# Patient Record
Sex: Female | Born: 1945 | Race: White | Hispanic: No | Marital: Married | Smoking: Never smoker
Health system: Southern US, Community
[De-identification: ages and names within clinical notes are randomized; demographics above are authoritative.]

## PROBLEM LIST (undated history)

## (undated) DIAGNOSIS — E785 Hyperlipidemia, unspecified: Secondary | ICD-10-CM

## (undated) DIAGNOSIS — N390 Urinary tract infection, site not specified: Secondary | ICD-10-CM

## (undated) DIAGNOSIS — G43909 Migraine, unspecified, not intractable, without status migrainosus: Secondary | ICD-10-CM

## (undated) DIAGNOSIS — I10 Essential (primary) hypertension: Secondary | ICD-10-CM

## (undated) HISTORY — PX: ABDOMINAL HYSTERECTOMY: SHX81

---

## 2009-04-25 ENCOUNTER — Observation Stay (HOSPITAL_COMMUNITY): Admission: EM | Admit: 2009-04-25 | Discharge: 2009-04-25 | Payer: Self-pay | Admitting: Emergency Medicine

## 2010-05-20 LAB — COMPREHENSIVE METABOLIC PANEL
ALT: 29 U/L (ref 0–35)
AST: 27 U/L (ref 0–37)
Albumin: 2.9 g/dL — ABNORMAL LOW (ref 3.5–5.2)
BUN: 11 mg/dL (ref 6–23)
CO2: 23 mEq/L (ref 19–32)
Chloride: 106 mEq/L (ref 96–112)
GFR calc Af Amer: >60 mL/min (ref >60)
Glucose, Bld: 126 mg/dL — ABNORMAL HIGH (ref 70–99)
Sodium: 136 mEq/L (ref 135–145)
Total Protein: 6.1 g/dL (ref 6.0–8.3)

## 2010-05-20 LAB — BASIC METABOLIC PANEL
Chloride: 104 mEq/L (ref 96–112)
Potassium: 3.8 mEq/L (ref 3.5–5.1)

## 2010-05-20 LAB — DIFFERENTIAL
Basophils Relative: 0 % (ref 0–1)
Basophils Relative: 0 % (ref 0–1)
Eosinophils Absolute: 0 10*3/uL (ref 0.0–0.7)
Eosinophils Absolute: 0 10*3/uL (ref 0.0–0.7)
Eosinophils Relative: 0 % (ref 0–5)
Lymphs Abs: 1.2 10*3/uL (ref 0.7–4.0)
Lymphs Abs: 1.6 10*3/uL (ref 0.7–4.0)
Monocytes Absolute: 0.8 10*3/uL (ref 0.1–1.0)
Monocytes Relative: 11 % (ref 3–12)
Neutro Abs: 3.8 10*3/uL (ref 1.7–7.7)
Neutrophils Relative %: 68 % (ref 43–77)

## 2010-05-20 LAB — GLUCOSE, CAPILLARY
Glucose-Capillary: 68 mg/dL — ABNORMAL LOW (ref 70–99)
Glucose-Capillary: 74 mg/dL (ref 70–99)
Glucose-Capillary: 80 mg/dL (ref 70–99)

## 2010-05-20 LAB — POCT CARDIAC MARKERS
CKMB, poc: <1 ng/mL — ABNORMAL LOW (ref 1.0–8.0)
Myoglobin, poc: 81.4 ng/mL (ref 12–200)

## 2010-05-20 LAB — CBC
HCT: 41.9 % (ref 36.0–46.0)
Hemoglobin: 14.3 g/dL (ref 12.0–15.0)
MCHC: 34.1 g/dL (ref 30.0–36.0)
MCHC: 34.8 g/dL (ref 30.0–36.0)
MCV: 86.9 fL (ref 78.0–100.0)
Platelets: 181 10*3/uL (ref 150–400)
Platelets: 232 10*3/uL (ref 150–400)
WBC: 5.8 10*3/uL (ref 4.0–10.5)

## 2010-05-20 LAB — RAPID STREP SCREEN (MED CTR MEBANE ONLY): Streptococcus, Group A Screen (Direct): NEGATIVE

## 2010-05-20 LAB — CULTURE, BLOOD (ROUTINE X 2)
Culture: NO GROWTH
Report Status: 3072011

## 2010-05-20 LAB — POCT I-STAT, CHEM 8
BUN: 13 mg/dL (ref 6–23)
Chloride: 108 mEq/L (ref 96–112)
Glucose, Bld: 69 mg/dL — ABNORMAL LOW (ref 70–99)
Potassium: 3.7 mEq/L (ref 3.5–5.1)
Sodium: 140 mEq/L (ref 135–145)

## 2010-05-20 LAB — HEMOGLOBIN A1C: Mean Plasma Glucose: 154 mg/dL

## 2011-02-03 ENCOUNTER — Encounter (HOSPITAL_COMMUNITY): Payer: Self-pay | Admitting: Pharmacy Technician

## 2011-02-10 ENCOUNTER — Encounter (HOSPITAL_COMMUNITY)
Admission: RE | Disposition: A | Discharge status: Home / Self Care | Payer: Self-pay | Source: Ambulatory Visit | Attending: Ophthalmology

## 2011-02-10 ENCOUNTER — Ambulatory Visit (HOSPITAL_COMMUNITY)
Admission: RE | Admit: 2011-02-10 | Discharge: 2011-02-10 | Disposition: A | Discharge status: Home / Self Care | Payer: Medicare Other | Source: Ambulatory Visit | Attending: Ophthalmology | Admitting: Ophthalmology

## 2011-02-10 ENCOUNTER — Encounter (HOSPITAL_COMMUNITY): Payer: Self-pay | Admitting: *Deleted

## 2011-02-10 DIAGNOSIS — Z7982 Long term (current) use of aspirin: Secondary | ICD-10-CM | POA: Insufficient documentation

## 2011-02-10 DIAGNOSIS — H109 Unspecified conjunctivitis: Secondary | ICD-10-CM | POA: Insufficient documentation

## 2011-02-10 DIAGNOSIS — I10 Essential (primary) hypertension: Secondary | ICD-10-CM | POA: Insufficient documentation

## 2011-02-10 DIAGNOSIS — E119 Type 2 diabetes mellitus without complications: Secondary | ICD-10-CM | POA: Insufficient documentation

## 2011-02-10 DIAGNOSIS — H4010X0 Unspecified open-angle glaucoma, stage unspecified: Secondary | ICD-10-CM | POA: Insufficient documentation

## 2011-02-10 HISTORY — DX: Essential (primary) hypertension: I10

## 2011-02-10 SURGERY — SLT LASER APPLICATION
Anesthesia: LOCAL | Site: Eye | Laterality: Right

## 2011-02-10 MED ORDER — PILOCARPINE HCL 1 % OP SOLN
2.0000 [drp] | Freq: Once | OPHTHALMIC | Status: AC
  Administered 2011-02-10: 2 [drp] via OPHTHALMIC

## 2011-02-10 MED ORDER — APRACLONIDINE HCL 1 % OP SOLN
OPHTHALMIC | Status: AC
  Administered 2011-02-10: 1 [drp] via OPHTHALMIC
  Filled 2011-02-10: qty 0.1

## 2011-02-10 MED ORDER — HYPROMELLOSE (GONIOSCOPIC) 2.5 % OP SOLN
OPHTHALMIC | Status: AC
  Filled 2011-02-10: qty 15

## 2011-02-10 MED ORDER — PILOCARPINE HCL 1 % OP SOLN
OPHTHALMIC | Status: AC
  Administered 2011-02-10: 2 [drp] via OPHTHALMIC
  Filled 2011-02-10: qty 15

## 2011-02-10 MED ORDER — APRACLONIDINE HCL 1 % OP SOLN
1.0000 [drp] | OPHTHALMIC | Status: AC
  Administered 2011-02-10 (×3): 1 [drp] via OPHTHALMIC

## 2011-02-10 MED ORDER — TETRACAINE HCL 0.5 % OP SOLN
1.0000 [drp] | Freq: Once | OPHTHALMIC | Status: AC
  Administered 2011-02-10: 1 [drp] via OPHTHALMIC

## 2011-02-10 MED ORDER — TETRACAINE HCL 0.5 % OP SOLN
OPHTHALMIC | Status: AC
  Administered 2011-02-10: 1 [drp] via OPHTHALMIC
  Filled 2011-02-10: qty 2

## 2011-02-10 NOTE — H&P (Signed)
Pt interviewed and examined without changes.

## 2011-02-10 NOTE — Procedures (Signed)
Linda Patrick 02/10/2011  [name]  Yag Laser Self Test Completedyes. Procedure:  SLT laser OD.  Eye Protection Worn by Staff yes. Laser In Use Sign on Door yes.  Laser: Nd:YAG Spot Size: Fixed Power Setting: 0.9 mJ/application Position treated: 360 deg Number of applications: 129 Total energy delivered: 116 mJ  Gonioscopy OD was performed and the angle was open grade 3 to 4 360 degree. There were no recessions or synechiae noted.  There was little pigment in the angle.  The patient tolerated the procedure without difficulty. No complications were encountered.  Tenometer reading immediately after procedure: 19 mmHg.  The patient was discharged home with the instructions to discontinue all her current glaucoma medications in the right eye and to continue them in the left eye.   Patient instructed to go to office as per instruction sheet for intraocular pressure check.  Patient verbalizes understanding of discharge instructions yes.   Notes:none

## 2011-02-10 NOTE — OR Nursing (Signed)
IO eye pressure 19 post procedure

## 2012-04-01 ENCOUNTER — Encounter (HOSPITAL_COMMUNITY): Payer: Self-pay

## 2012-04-01 ENCOUNTER — Emergency Department (HOSPITAL_COMMUNITY)
Admission: EM | Admit: 2012-04-01 | Discharge: 2012-04-01 | Disposition: A | Discharge status: Home / Self Care | Payer: Medicare Other | Attending: Emergency Medicine | Admitting: Emergency Medicine

## 2012-04-01 DIAGNOSIS — Z7982 Long term (current) use of aspirin: Secondary | ICD-10-CM | POA: Insufficient documentation

## 2012-04-01 DIAGNOSIS — N390 Urinary tract infection, site not specified: Secondary | ICD-10-CM | POA: Insufficient documentation

## 2012-04-01 DIAGNOSIS — R319 Hematuria, unspecified: Secondary | ICD-10-CM | POA: Insufficient documentation

## 2012-04-01 DIAGNOSIS — I10 Essential (primary) hypertension: Secondary | ICD-10-CM | POA: Insufficient documentation

## 2012-04-01 DIAGNOSIS — R300 Dysuria: Secondary | ICD-10-CM | POA: Insufficient documentation

## 2012-04-01 DIAGNOSIS — E119 Type 2 diabetes mellitus without complications: Secondary | ICD-10-CM | POA: Insufficient documentation

## 2012-04-01 DIAGNOSIS — Z79899 Other long term (current) drug therapy: Secondary | ICD-10-CM | POA: Insufficient documentation

## 2012-04-01 LAB — URINALYSIS, ROUTINE W REFLEX MICROSCOPIC
Bilirubin Urine: NEGATIVE
Ketones, ur: NEGATIVE mg/dL
Nitrite: POSITIVE — AB
Urobilinogen, UA: 0.2 mg/dL (ref 0.0–1.0)

## 2012-04-01 LAB — URINE MICROSCOPIC-ADD ON

## 2012-04-01 MED ORDER — PHENAZOPYRIDINE HCL 200 MG PO TABS: 200.0000 mg | ORAL_TABLET | Freq: Three times a day (TID) | ORAL | Status: DC

## 2012-04-01 MED ORDER — CIPROFLOXACIN HCL 250 MG PO TABS: 250.0000 mg | ORAL_TABLET | Freq: Two times a day (BID) | ORAL | Status: DC

## 2012-04-01 MED ORDER — PHENAZOPYRIDINE HCL 100 MG PO TABS
200.0000 mg | ORAL_TABLET | Freq: Once | ORAL | Status: AC
  Administered 2012-04-01: 200 mg via ORAL
  Filled 2012-04-01: qty 2

## 2012-04-01 MED ORDER — CIPROFLOXACIN HCL 250 MG PO TABS
500.0000 mg | ORAL_TABLET | Freq: Once | ORAL | Status: AC
  Administered 2012-04-01: 500 mg via ORAL
  Filled 2012-04-01: qty 2

## 2012-04-01 NOTE — ED Provider Notes (Signed)
History     CSN: [id number]  Arrival date & time 04/01/12  0149   First MD Initiated Contact with Patient 04/01/12 0214      Chief Complaint  Patient presents with  . Urinary Frequency    (Consider location/radiation/quality/duration/timing/severity/associated sxs/prior treatment) HPI Linda Patrick is a 67 y.o. female who presents to the Emergency Department complaining of urinary frequency and buring with urination x 2 days. Tonight the frequency became the problem. She was going to the bathroom every few minutes each time with severe pain. She has taken no medicines.Denies back pain, nausea, vomiting.   PCP Dr. Charm Barges Past Medical History  Diagnosis Date  . Hypertension   . Diabetes mellitus     History reviewed. No pertinent past surgical history.  No family history on file.  History  Substance Use Topics  . Smoking status: Never Smoker   . Smokeless tobacco: Not on file  . Alcohol Use: No    OB History    Grav Para Term Preterm Abortions TAB SAB Ect Mult Living                  Review of Systems  Constitutional: Negative for fever.       10 Systems reviewed and are negative for acute change except as noted in the HPI.  HENT: Negative for congestion.   Eyes: Negative for discharge and redness.  Respiratory: Negative for cough and shortness of breath.   Cardiovascular: Negative for chest pain.  Gastrointestinal: Negative for vomiting and abdominal pain.  Genitourinary: Positive for dysuria, frequency and hematuria.  Musculoskeletal: Negative for back pain.  Skin: Negative for rash.  Neurological: Negative for syncope, numbness and headaches.  Psychiatric/Behavioral:       No behavior change.    Allergies  Nitrofuran derivatives; Nubain; and Vistaril  Home Medications   Current Outpatient Rx  Name  Route  Sig  Dispense  Refill  . AMITRIPTYLINE HCL 50 MG PO TABS   Oral   Take 50 mg by mouth at bedtime.           . ASPIRIN EC 81 MG PO TBEC   Oral  Take 81 mg by mouth daily.           Marland Kitchen BENAZEPRIL HCL 20 MG PO TABS   Oral   Take 20 mg by mouth daily.           Marland Kitchen VITAMIN D-3 5000 UNITS PO TABS   Oral   Take 1 tablet by mouth 2 (two) times daily.           . FENOFIBRATE 160 MG PO TABS   Oral   Take 160 mg by mouth daily.           Marland Kitchen GLIMEPIRIDE 4 MG PO TABS   Oral   Take 8 mg by mouth daily before breakfast.           . IBUPROFEN 200 MG PO TABS   Oral   Take 400 mg by mouth every 6 (six) hours as needed. For pain          . METFORMIN HCL 1000 MG PO TABS   Oral   Take 1,000 mg by mouth daily with breakfast.           . PRAVASTATIN SODIUM 40 MG PO TABS   Oral   Take 40 mg by mouth daily.           Marland Kitchen OLMESARTAN MEDOXOMIL-HCTZ 40-12.5 MG PO TABS  Oral   Take 1 tablet by mouth daily.             BP 177/90  Pulse 91  Temp 97.7 F (36.5 C) (Oral)  Resp 18  Ht 5\' 6"  (1.676 m)  Wt 145 lb (65.772 kg)  BMI 23.40 kg/m2  SpO2 100%  Physical Exam  Nursing note and vitals reviewed. HENT:  Head: Atraumatic.  Eyes: Right eye exhibits no discharge. Left eye exhibits no discharge.  Neck: Neck supple.  Pulmonary/Chest: Effort normal. She exhibits no tenderness.  Abdominal: Soft. There is no tenderness. There is no rebound.  Musculoskeletal: She exhibits no tenderness.       Baseline ROM, no obvious new focal weakness.  Neurological:       Mental status and motor strength appears baseline for patient and situation.  Skin: No rash noted.  Psychiatric: She has a normal mood and affect.    ED Course  Procedures (including critical care time) Results for orders placed during the hospital encounter of 04/01/12  URINALYSIS, ROUTINE W REFLEX MICROSCOPIC      Component Value Range   Color, Urine YELLOW  YELLOW   APPearance TURBID (*) CLEAR   Specific Gravity, Urine 1.025  1.005 - 1.030   pH 6.0  5.0 - 8.0   Glucose, UA NEGATIVE  NEGATIVE mg/dL   Hgb urine dipstick LARGE (*) NEGATIVE   Bilirubin Urine  NEGATIVE  NEGATIVE   Ketones, ur NEGATIVE  NEGATIVE mg/dL   Protein, ur 161 (*) NEGATIVE mg/dL   Urobilinogen, UA 0.2  0.0 - 1.0 mg/dL   Nitrite POSITIVE (*) NEGATIVE   Leukocytes, UA MODERATE (*) NEGATIVE  URINE MICROSCOPIC-ADD ON      Component Value Range   Squamous Epithelial / LPF RARE  RARE   WBC, UA TOO NUMEROUS TO COUNT  <3 WBC/hpf   RBC / HPF TOO NUMEROUS TO COUNT  <3 RBC/hpf   Bacteria, UA MANY (*) RARE         MDM  Patient with urinary frequency and dysuria. UA positive for infection. Given pyridium and cipro. UA culture pending. Pt stable in ED with no significant deterioration in condition.The patient appears reasonably screened and/or stabilized for discharge and I doubt any other medical condition or other [facility] requiring further screening, evaluation, or treatment in the ED at this time prior to discharge. MDM Reviewed: nursing note and vitals Interpretation: labs           Nicoletta Dress. Colon Branch, MD 04/01/12 0960

## 2012-04-01 NOTE — ED Notes (Signed)
Pain and burning with urination, frequency x 2 days

## 2012-04-02 LAB — URINE CULTURE

## 2012-04-03 NOTE — ED Notes (Signed)
+  Urine. Patient treated with Cipro. Sensitive to same. Per protocol MD. °

## 2012-04-09 MED ORDER — TROPICAMIDE 1 % OP SOLN: 1.0000 [drp] | OPHTHALMIC | Status: DC

## 2012-04-12 ENCOUNTER — Ambulatory Visit (HOSPITAL_COMMUNITY)
Admission: RE | Admit: 2012-04-12 | Discharge: 2012-04-12 | Disposition: A | Discharge status: Home / Self Care | Payer: Medicare Other | Source: Ambulatory Visit | Attending: Ophthalmology | Admitting: Ophthalmology

## 2012-04-12 ENCOUNTER — Encounter (HOSPITAL_COMMUNITY)
Admission: RE | Disposition: A | Discharge status: Home / Self Care | Payer: Self-pay | Source: Ambulatory Visit | Attending: Ophthalmology

## 2012-04-12 ENCOUNTER — Encounter (HOSPITAL_COMMUNITY): Payer: Self-pay | Admitting: *Deleted

## 2012-04-12 DIAGNOSIS — E119 Type 2 diabetes mellitus without complications: Secondary | ICD-10-CM | POA: Insufficient documentation

## 2012-04-12 DIAGNOSIS — H4010X0 Unspecified open-angle glaucoma, stage unspecified: Secondary | ICD-10-CM | POA: Insufficient documentation

## 2012-04-12 HISTORY — DX: Hyperlipidemia, unspecified: E78.5

## 2012-04-12 HISTORY — PX: SLT LASER APPLICATION: SHX6099

## 2012-04-12 HISTORY — DX: Migraine, unspecified, not intractable, without status migrainosus: G43.909

## 2012-04-12 SURGERY — SLT LASER APPLICATION
Anesthesia: LOCAL | Laterality: Left

## 2012-04-12 MED ORDER — TETRACAINE HCL 0.5 % OP SOLN
OPHTHALMIC | Status: AC
  Filled 2012-04-12: qty 2

## 2012-04-12 MED ORDER — APRACLONIDINE HCL 1 % OP SOLN
1.0000 [drp] | OPHTHALMIC | Status: AC
  Administered 2012-04-12 (×3): 1 [drp] via OPHTHALMIC

## 2012-04-12 MED ORDER — PILOCARPINE HCL 1 % OP SOLN
OPHTHALMIC | Status: AC
  Filled 2012-04-12: qty 15

## 2012-04-12 MED ORDER — TETRACAINE HCL 0.5 % OP SOLN
1.0000 [drp] | Freq: Once | OPHTHALMIC | Status: AC
  Administered 2012-04-12: 1 [drp] via OPHTHALMIC

## 2012-04-12 MED ORDER — PILOCARPINE HCL 1 % OP SOLN
1.0000 [drp] | Freq: Once | OPHTHALMIC | Status: AC
  Administered 2012-04-12: 1 [drp] via OPHTHALMIC

## 2012-04-12 MED ORDER — APRACLONIDINE HCL 1 % OP SOLN
OPHTHALMIC | Status: AC
  Filled 2012-04-12: qty 0.1

## 2012-04-12 NOTE — H&P (Signed)
I have reviewed the pre printed H&P, the patient was re-examined, and I have identified no significant interval changes in the patient's medical condition.  There is no change in the plan of care since the history and physical of record. 

## 2012-04-12 NOTE — Brief Op Note (Signed)
Linda Patrick 04/12/2012  [name], MD  Yag Laser Self Test Completedyes. Procedure: SLT, left eye.  Eye Protection Worn by Staff yes. Laser In Use Sign on Door yes.  Laser: Nd:YAG Spot Size: Fixed Burst Mode: III Power Setting: 1.0-0.9 mJ/burst Position treated: 360 degrees trabecular mechwork Number of shots: 127 Total energy delivered: 115 mJ  The patient tolerated the procedure without difficulty. No complications were encountered.  Tenometer reading immediately after procedure: 22 mmHg.  The patient was discharged home with the instructions to discontinue all her current glaucoma medications.   Patient verbalizes understanding of discharge instructions yes.   Notes:gonioscopy open 360 degrees with several clock hours of closedangle superiorly, otherwise grade 4/4 open with mild pigmentation

## 2012-04-12 NOTE — Op Note (Signed)
No op note required 

## 2012-04-14 ENCOUNTER — Encounter (HOSPITAL_COMMUNITY): Payer: Self-pay | Admitting: Ophthalmology

## 2013-07-21 ENCOUNTER — Encounter (HOSPITAL_COMMUNITY): Payer: Self-pay | Admitting: Emergency Medicine

## 2013-07-21 ENCOUNTER — Emergency Department (HOSPITAL_COMMUNITY)
Admission: EM | Admit: 2013-07-21 | Discharge: 2013-07-21 | Disposition: A | Discharge status: Home / Self Care | Payer: Medicare Other | Attending: Emergency Medicine | Admitting: Emergency Medicine

## 2013-07-21 DIAGNOSIS — S81852A Open bite, left lower leg, initial encounter: Secondary | ICD-10-CM

## 2013-07-21 DIAGNOSIS — Z79899 Other long term (current) drug therapy: Secondary | ICD-10-CM | POA: Insufficient documentation

## 2013-07-21 DIAGNOSIS — Z23 Encounter for immunization: Secondary | ICD-10-CM | POA: Insufficient documentation

## 2013-07-21 DIAGNOSIS — Y9389 Activity, other specified: Secondary | ICD-10-CM | POA: Insufficient documentation

## 2013-07-21 DIAGNOSIS — Z7982 Long term (current) use of aspirin: Secondary | ICD-10-CM | POA: Insufficient documentation

## 2013-07-21 DIAGNOSIS — E119 Type 2 diabetes mellitus without complications: Secondary | ICD-10-CM | POA: Insufficient documentation

## 2013-07-21 DIAGNOSIS — E785 Hyperlipidemia, unspecified: Secondary | ICD-10-CM | POA: Insufficient documentation

## 2013-07-21 DIAGNOSIS — W540XXA Bitten by dog, initial encounter: Secondary | ICD-10-CM | POA: Insufficient documentation

## 2013-07-21 DIAGNOSIS — Y929 Unspecified place or not applicable: Secondary | ICD-10-CM | POA: Insufficient documentation

## 2013-07-21 DIAGNOSIS — S81009A Unspecified open wound, unspecified knee, initial encounter: Secondary | ICD-10-CM | POA: Insufficient documentation

## 2013-07-21 DIAGNOSIS — S81809A Unspecified open wound, unspecified lower leg, initial encounter: Principal | ICD-10-CM

## 2013-07-21 DIAGNOSIS — G43909 Migraine, unspecified, not intractable, without status migrainosus: Secondary | ICD-10-CM | POA: Insufficient documentation

## 2013-07-21 DIAGNOSIS — S91009A Unspecified open wound, unspecified ankle, initial encounter: Principal | ICD-10-CM

## 2013-07-21 DIAGNOSIS — I10 Essential (primary) hypertension: Secondary | ICD-10-CM | POA: Insufficient documentation

## 2013-07-21 LAB — CBG MONITORING, ED: GLUCOSE-CAPILLARY: 157 mg/dL — AB (ref 70–99)

## 2013-07-21 MED ORDER — BACITRACIN-NEOMYCIN-POLYMYXIN 400-5-5000 EX OINT
TOPICAL_OINTMENT | Freq: Once | CUTANEOUS | Status: AC
  Administered 2013-07-21: 20:00:00 via TOPICAL
  Filled 2013-07-21: qty 1

## 2013-07-21 MED ORDER — HYDROCODONE-ACETAMINOPHEN 5-325 MG PO TABS
1.0000 | ORAL_TABLET | Freq: Once | ORAL | Status: DC
  Filled 2013-07-21: qty 1

## 2013-07-21 MED ORDER — HYDROCODONE-ACETAMINOPHEN 5-325 MG PO TABS: 1.0000 | ORAL_TABLET | Freq: Four times a day (QID) | ORAL | Status: DC | PRN

## 2013-07-21 MED ORDER — TETANUS-DIPHTH-ACELL PERTUSSIS 5-2.5-18.5 LF-MCG/0.5 IM SUSP
0.5000 mL | Freq: Once | INTRAMUSCULAR | Status: AC
  Administered 2013-07-21: 0.5 mL via INTRAMUSCULAR
  Filled 2013-07-21: qty 0.5

## 2013-07-21 MED ORDER — ONDANSETRON HCL 4 MG PO TABS
4.0000 mg | ORAL_TABLET | Freq: Once | ORAL | Status: DC
  Filled 2013-07-21: qty 1

## 2013-07-21 MED ORDER — AMOXICILLIN-POT CLAVULANATE 875-125 MG PO TABS: 1.0000 | ORAL_TABLET | Freq: Two times a day (BID) | ORAL | Status: DC

## 2013-07-21 MED ORDER — AMOXICILLIN-POT CLAVULANATE 875-125 MG PO TABS
1.0000 | ORAL_TABLET | Freq: Once | ORAL | Status: AC
  Administered 2013-07-21: 1 via ORAL
  Filled 2013-07-21: qty 1

## 2013-07-21 NOTE — ED Notes (Signed)
Sheriff in to see patient

## 2013-07-21 NOTE — ED Provider Notes (Signed)
CSN: [id number]     Arrival date & time 07/21/13  1747 History   First MD Initiated Contact with Patient 07/21/13 1843     Chief Complaint  Patient presents with  . Animal Bite     (Consider location/radiation/quality/duration/timing/severity/associated sxs/prior Treatment) HPI Comments:  Patient is a 68 year old female who presents to the emergency department with an animal bite. The patient states that a family members dog bit her on the left lower leg. She noted some mild bleeding, but this is been controlled with pressure. The patient has notified the sheriff and they will come out to observe the dog for any illnesses or diseases. The patient states she is diabetic, but she denies being on any anticoagulation medications. She's not had any previous operations or procedures on the left lower extremity.   Patient is a 68 y.o. female presenting with animal bite. The history is provided by the patient.  Animal Bite Contact animal:  Dog   Past Medical History  Diagnosis Date  . Hypertension   . Diabetes mellitus   . Hyperlipemia   . Migraine    Past Surgical History  Procedure Laterality Date  . Abdominal hysterectomy    . Slt laser application Left 04/12/2012    Procedure: SLT LASER APPLICATION;  Surgeon: Susa Simmondsarroll F Haines, MD;  Location: [facility];  Service: Ophthalmology;  Laterality: Left;   History reviewed. No pertinent family history. History  Substance Use Topics  . Smoking status: Never Smoker   . Smokeless tobacco: Not on file  . Alcohol Use: No   OB History   Grav Para Term Preterm Abortions TAB SAB Ect Mult Living                 Review of Systems  Constitutional: Negative for activity change.       All ROS Neg except as noted in HPI  HENT: Negative for nosebleeds.   Eyes: Negative for photophobia and discharge.  Respiratory: Negative for cough, shortness of breath and wheezing.   Cardiovascular: Negative for chest pain and palpitations.  Gastrointestinal:  Negative for abdominal pain and blood in stool.  Genitourinary: Negative for dysuria, frequency and hematuria.  Musculoskeletal: Positive for arthralgias. Negative for back pain and neck pain.  Skin: Negative.   Neurological: Positive for headaches. Negative for dizziness, seizures and speech difficulty.  Psychiatric/Behavioral: Negative for hallucinations and confusion.      Allergies  Nitrofuran derivatives; Nubain; and Vistaril  Home Medications   Prior to Admission medications   Medication Sig Start Date End Date Taking? Authorizing Provider  amitriptyline (ELAVIL) 50 MG tablet Take 50 mg by mouth at bedtime.     Yes Historical Provider, MD  amLODipine (NORVASC) 5 MG tablet Take 5 mg by mouth daily.   Yes Historical Provider, MD  aspirin EC 81 MG tablet Take 81 mg by mouth daily.     Yes Historical Provider, MD  benazepril (LOTENSIN) 20 MG tablet Take 20 mg by mouth daily.     Yes Historical Provider, MD  Cholecalciferol (VITAMIN D-3) 5000 UNITS TABS Take 1 tablet by mouth 2 (two) times daily.     Yes Historical Provider, MD  fenofibrate 160 MG tablet Take 160 mg by mouth daily.     Yes Historical Provider, MD  glimepiride (AMARYL) 4 MG tablet Take 8 mg by mouth daily before breakfast.     Yes Historical Provider, MD  ibuprofen (ADVIL,MOTRIN) 200 MG tablet Take 400 mg by mouth every 6 (six) hours as needed.  For pain    Yes Historical Provider, MD  metFORMIN (GLUCOPHAGE) 1000 MG tablet Take 1,000 mg by mouth daily with breakfast.     Yes Historical Provider, MD  olmesartan-hydrochlorothiazide (BENICAR HCT) 40-12.5 MG per tablet Take 1 tablet by mouth daily.     Yes Historical Provider, MD  pravastatin (PRAVACHOL) 40 MG tablet Take 40 mg by mouth daily.     Yes Historical Provider, MD   BP 139/73  Pulse 93  Temp(Src) 98.4 F (36.9 C) (Oral)  Resp 18  SpO2 98% Physical Exam  Nursing note and vitals reviewed. Constitutional: She is oriented to person, place, and time. She appears  well-developed and well-nourished.  Non-toxic appearance.  HENT:  Head: Normocephalic.  Right Ear: Tympanic membrane and external ear normal.  Left Ear: Tympanic membrane and external ear normal.  Eyes: EOM and lids are normal. Pupils are equal, round, and reactive to light.  Neck: Normal range of motion. Neck supple. Carotid bruit is not present.  Cardiovascular: Normal rate, regular rhythm, normal heart sounds, intact distal pulses and normal pulses.   Pulmonary/Chest: Breath sounds normal. No respiratory distress.  Abdominal: Soft. Bowel sounds are normal. There is no tenderness. There is no guarding.  Musculoskeletal: Normal range of motion.       Legs: There is full range of motion of the left hip and knee. The animal bite just below the knee on the lateral surface. There is full range of motion of the left ankle, and full range of motion of all toes. The dorsalis pedis pulses 2+.  Lymphadenopathy:       Head (right side): No submandibular adenopathy present.       Head (left side): No submandibular adenopathy present.    She has no cervical adenopathy.  Neurological: She is alert and oriented to person, place, and time. She has normal strength. No cranial nerve deficit or sensory deficit.  Skin: Skin is warm and dry.  Psychiatric: She has a normal mood and affect. Her speech is normal.    ED Course  Procedures (including critical care time) Labs Review Labs Reviewed  CBG MONITORING, ED - Abnormal; Notable for the following:    Glucose-Capillary 157 (*)    All other components within normal limits    Imaging Review No results found.   EKG Interpretation None      MDM The sheriff came to the emergency department to get information from the family concerning the dog. They will coordinate pain the dog and observe for any illnesses or diseases.  Prescription for Augmentin has been given to the patient. Prescription for Norco was also given to the patient. She is encouraged  to use Tylenol or ibuprofen first and to use the Norco if needed. The patient is also given instructions to return if any signs of increasing infection.    Final diagnoses:  None    **I have reviewed nursing notes, vital signs, and all appropriate lab and imaging results for this patient.Kathie Dike, PA-C 07/21/13 2013

## 2013-07-21 NOTE — ED Notes (Signed)
Pt presents with dog bite to left lower leg. Bleeding controlled pta. Pt states she notified sheriff about incident.

## 2013-07-21 NOTE — ED Provider Notes (Signed)
Medical screening examination/treatment/procedure(s) were performed by non-physician practitioner and as supervising physician I was immediately available for consultation/collaboration.   EKG Interpretation None        Gilda Crease, MD 07/21/13 2015

## 2013-07-21 NOTE — ED Notes (Signed)
Animal Control notified of patients dog bite. Sheriff coming to [employer] to speak with patient

## 2013-07-21 NOTE — Discharge Instructions (Signed)
Please cleanse the wound to your leg with soap and water daily, and apply a Band-Aid or dressing. Please use the Augmentin 2 times daily with food. Use Tylenol or ibuprofen for mild soreness, may use Norco for more severe soreness. Norco may cause drowsiness, please use with caution. The animal control office will call you with information concerning the dog that bit you, and whether or not any additional rabies medications will be needed. Animal Bite An animal bite can result in a scratch on the skin, deep open cut, puncture of the skin, crush injury, or tearing away of the skin or a body part. Dogs are responsible for most animal bites. Children are bitten more often than adults. An animal bite can range from very mild to more serious. A small bite from your house pet is no cause for alarm. However, some animal bites can become infected or injure a bone or other tissue. You must seek medical care if:  The skin is broken and bleeding does not slow down or stop after 15 minutes.  The puncture is deep and difficult to clean (such as a cat bite).  Pain, warmth, redness, or pus develops around the wound.  The bite is from a stray animal or rodent. There may be a risk of rabies infection.  The bite is from a snake, raccoon, skunk, fox, coyote, or bat. There may be a risk of rabies infection.  The person bitten has a chronic illness such as diabetes, liver disease, or cancer, or the person takes medicine that lowers the immune system.  There is concern about the location and severity of the bite. It is important to clean and protect an animal bite wound right away to prevent infection. Follow these steps:  Clean the wound with plenty of water and soap.  Apply an antibiotic cream.  Apply gentle pressure over the wound with a clean towel or gauze to slow or stop bleeding.  Elevate the affected area above the heart to help stop any bleeding.  Seek medical care. Getting medical care within 8 hours  of the animal bite leads to the best possible outcome. DIAGNOSIS  Your caregiver will most likely:  Take a detailed history of the animal and the bite injury.  Perform a wound exam.  Take your medical history. Blood tests or X-rays may be performed. Sometimes, infected bite wounds are cultured and sent to a lab to identify the infectious bacteria.  TREATMENT  Medical treatment will depend on the location and type of animal bite as well as the patient's medical history. Treatment may include:  Wound care, such as cleaning and flushing the wound with saline solution, bandaging, and elevating the affected area.  Antibiotics.  Tetanus immunization.  Rabies immunization.  Leaving the wound open to heal. This is often done with animal bites, due to the high risk of infection. However, in certain cases, wound closure with stitches, wound adhesive, skin adhesive strips, or staples may be used. Infected bites that are left untreated may require intravenous (IV) antibiotics and surgical treatment in the hospital. HOME CARE INSTRUCTIONS  Follow your caregiver's instructions for wound care.  Take all medicines as directed.  If your caregiver prescribes antibiotics, take them as directed. Finish them even if you start to feel better.  Follow up with your caregiver for further exams or immunizations as directed. You may need a tetanus shot if:  You cannot remember when you had your last tetanus shot.  You have never had a tetanus  shot.  The injury broke your skin. If you get a tetanus shot, your arm may swell, get red, and feel warm to the touch. This is common and not a problem. If you need a tetanus shot and you choose not to have one, there is a rare chance of getting tetanus. Sickness from tetanus can be serious. SEEK MEDICAL CARE IF:  You notice warmth, redness, soreness, swelling, pus discharge, or a bad smell coming from the wound.  You have a red line on the skin coming from  the wound.  You have a fever, chills, or a general ill feeling.  You have nausea or vomiting.  You have continued or worsening pain.  You have trouble moving the injured part.  You have other questions or concerns. MAKE SURE YOU:  Understand these instructions.  Will watch your condition.  Will get help right away if you are not doing well or get worse. Document Released: 10/29/2010 Document Revised: 05/05/2011 Document Reviewed: 10/29/2010 [facility] Patient Information 2014 [place], [place].

## 2014-05-11 ENCOUNTER — Emergency Department (HOSPITAL_COMMUNITY): Payer: Medicare Other

## 2014-05-11 ENCOUNTER — Inpatient Hospital Stay (HOSPITAL_COMMUNITY)
Admission: EM | Admit: 2014-05-11 | Discharge: 2014-05-12 | DRG: 194 | Disposition: A | Discharge status: Home / Self Care | Payer: Medicare Other | Attending: Internal Medicine | Admitting: Internal Medicine

## 2014-05-11 ENCOUNTER — Encounter (HOSPITAL_COMMUNITY): Payer: Self-pay

## 2014-05-11 DIAGNOSIS — R197 Diarrhea, unspecified: Secondary | ICD-10-CM | POA: Diagnosis not present

## 2014-05-11 DIAGNOSIS — E785 Hyperlipidemia, unspecified: Secondary | ICD-10-CM | POA: Diagnosis not present

## 2014-05-11 DIAGNOSIS — E119 Type 2 diabetes mellitus without complications: Secondary | ICD-10-CM

## 2014-05-11 DIAGNOSIS — Z79899 Other long term (current) drug therapy: Secondary | ICD-10-CM

## 2014-05-11 DIAGNOSIS — R509 Fever, unspecified: Secondary | ICD-10-CM

## 2014-05-11 DIAGNOSIS — E876 Hypokalemia: Secondary | ICD-10-CM | POA: Diagnosis not present

## 2014-05-11 DIAGNOSIS — Z7982 Long term (current) use of aspirin: Secondary | ICD-10-CM

## 2014-05-11 DIAGNOSIS — B349 Viral infection, unspecified: Secondary | ICD-10-CM

## 2014-05-11 DIAGNOSIS — Z23 Encounter for immunization: Secondary | ICD-10-CM | POA: Diagnosis not present

## 2014-05-11 DIAGNOSIS — I10 Essential (primary) hypertension: Secondary | ICD-10-CM | POA: Diagnosis not present

## 2014-05-11 DIAGNOSIS — J09X2 Influenza due to identified novel influenza A virus with other respiratory manifestations: Principal | ICD-10-CM | POA: Diagnosis present

## 2014-05-11 DIAGNOSIS — E11649 Type 2 diabetes mellitus with hypoglycemia without coma: Secondary | ICD-10-CM | POA: Diagnosis present

## 2014-05-11 DIAGNOSIS — J1189 Influenza due to unidentified influenza virus with other manifestations: Secondary | ICD-10-CM

## 2014-05-11 DIAGNOSIS — E860 Dehydration: Secondary | ICD-10-CM

## 2014-05-11 DIAGNOSIS — N390 Urinary tract infection, site not specified: Secondary | ICD-10-CM

## 2014-05-11 DIAGNOSIS — R6889 Other general symptoms and signs: Secondary | ICD-10-CM

## 2014-05-11 DIAGNOSIS — J111 Influenza due to unidentified influenza virus with other respiratory manifestations: Secondary | ICD-10-CM | POA: Insufficient documentation

## 2014-05-11 LAB — CBC WITH DIFFERENTIAL/PLATELET
BASOS PCT: 0 % (ref 0–1)
Basophils Absolute: 0 10*3/uL (ref 0.0–0.1)
EOS PCT: 0 % (ref 0–5)
Eosinophils Absolute: 0 10*3/uL (ref 0.0–0.7)
HEMATOCRIT: 39.2 % (ref 36.0–46.0)
HEMOGLOBIN: 13.2 g/dL (ref 12.0–15.0)
Lymphocytes Relative: 8 % — ABNORMAL LOW (ref 12–46)
Lymphs Abs: 0.4 10*3/uL — ABNORMAL LOW (ref 0.7–4.0)
MCH: 28.4 pg (ref 26.0–34.0)
MCHC: 33.7 g/dL (ref 30.0–36.0)
MCV: 84.5 fL (ref 78.0–100.0)
Monocytes Absolute: 0.5 10*3/uL (ref 0.1–1.0)
Monocytes Relative: 10 % (ref 3–12)
NEUTROS ABS: 3.9 10*3/uL (ref 1.7–7.7)
Neutrophils Relative %: 82 % — ABNORMAL HIGH (ref 43–77)
PLATELETS: 193 10*3/uL (ref 150–400)
RBC: 4.64 MIL/uL (ref 3.87–5.11)
RDW: 14.4 % (ref 11.5–15.5)
WBC: 4.8 10*3/uL (ref 4.0–10.5)

## 2014-05-11 LAB — COMPREHENSIVE METABOLIC PANEL
ALBUMIN: 4.2 g/dL (ref 3.5–5.2)
ALT: 39 U/L — ABNORMAL HIGH (ref 0–35)
AST: 48 U/L — ABNORMAL HIGH (ref 0–37)
Alkaline Phosphatase: 36 U/L — ABNORMAL LOW (ref 39–117)
Anion gap: 11 (ref 5–15)
BILIRUBIN TOTAL: 0.7 mg/dL (ref 0.3–1.2)
BUN: 25 mg/dL — AB (ref 6–23)
CALCIUM: 10 mg/dL (ref 8.4–10.5)
CO2: 24 mmol/L (ref 19–32)
CREATININE: 1.14 mg/dL — AB (ref 0.50–1.10)
Chloride: 106 mmol/L (ref 96–112)
GFR calc Af Amer: 56 mL/min — ABNORMAL LOW (ref >90)
GFR calc non Af Amer: 48 mL/min — ABNORMAL LOW (ref >90)
GLUCOSE: 85 mg/dL (ref 70–99)
Potassium: 3.5 mmol/L (ref 3.5–5.1)
Sodium: 141 mmol/L (ref 135–145)
Total Protein: 8 g/dL (ref 6.0–8.3)

## 2014-05-11 LAB — GLUCOSE, CAPILLARY
GLUCOSE-CAPILLARY: 146 mg/dL — AB (ref 70–99)
GLUCOSE-CAPILLARY: 46 mg/dL — AB (ref 70–99)
GLUCOSE-CAPILLARY: 53 mg/dL — AB (ref 70–99)
GLUCOSE-CAPILLARY: 58 mg/dL — AB (ref 70–99)
GLUCOSE-CAPILLARY: 89 mg/dL (ref 70–99)
Glucose-Capillary: 79 mg/dL (ref 70–99)
Glucose-Capillary: 66 mg/dL — ABNORMAL LOW (ref 70–99)
Glucose-Capillary: 76 mg/dL (ref 70–99)
Glucose-Capillary: 32 mg/dL — CL (ref 70–99)

## 2014-05-11 LAB — BASIC METABOLIC PANEL
Anion gap: 9 (ref 5–15)
BUN: 22 mg/dL (ref 6–23)
CHLORIDE: 108 mmol/L (ref 96–112)
CO2: 21 mmol/L (ref 19–32)
Calcium: 8.6 mg/dL (ref 8.4–10.5)
Creatinine, Ser: 1.01 mg/dL (ref 0.50–1.10)
GFR calc non Af Amer: 56 mL/min — ABNORMAL LOW (ref >90)
GFR, EST AFRICAN AMERICAN: 65 mL/min — AB (ref >90)
Glucose, Bld: 170 mg/dL — ABNORMAL HIGH (ref 70–99)
Potassium: 3 mmol/L — ABNORMAL LOW (ref 3.5–5.1)
Sodium: 138 mmol/L (ref 135–145)

## 2014-05-11 LAB — CBC
HCT: 33.1 % — ABNORMAL LOW (ref 36.0–46.0)
HEMOGLOBIN: 10.8 g/dL — AB (ref 12.0–15.0)
MCH: 27.6 pg (ref 26.0–34.0)
MCHC: 32.6 g/dL (ref 30.0–36.0)
MCV: 84.7 fL (ref 78.0–100.0)
Platelets: 168 10*3/uL (ref 150–400)
RBC: 3.91 MIL/uL (ref 3.87–5.11)
RDW: 14.4 % (ref 11.5–15.5)
WBC: 4.5 10*3/uL (ref 4.0–10.5)

## 2014-05-11 LAB — URINE MICROSCOPIC-ADD ON

## 2014-05-11 LAB — INFLUENZA PANEL BY PCR (TYPE A & B)
H1N1FLUPCR: NOT DETECTED
INFLBPCR: NEGATIVE
Influenza A By PCR: POSITIVE — AB

## 2014-05-11 LAB — CLOSTRIDIUM DIFFICILE BY PCR: Toxigenic C. Difficile by PCR: NEGATIVE

## 2014-05-11 LAB — URINALYSIS, ROUTINE W REFLEX MICROSCOPIC
Bilirubin Urine: NEGATIVE
GLUCOSE, UA: NEGATIVE mg/dL
KETONES UR: NEGATIVE mg/dL
NITRITE: POSITIVE — AB
PROTEIN: NEGATIVE mg/dL
Specific Gravity, Urine: 1.02 (ref 1.005–1.030)
Urobilinogen, UA: 1 mg/dL (ref 0.0–1.0)
pH: 8 (ref 5.0–8.0)

## 2014-05-11 LAB — CBG MONITORING, ED: GLUCOSE-CAPILLARY: 69 mg/dL — AB (ref 70–99)

## 2014-05-11 LAB — I-STAT CG4 LACTIC ACID, ED: LACTIC ACID, VENOUS: 1.54 mmol/L (ref 0.5–2.0)

## 2014-05-11 MED ORDER — AMLODIPINE BESYLATE 5 MG PO TABS
5.0000 mg | ORAL_TABLET | Freq: Every day | ORAL | Status: DC
  Administered 2014-05-11 – 2014-05-12 (×2): 5 mg via ORAL
  Filled 2014-05-11 (×2): qty 1

## 2014-05-11 MED ORDER — SODIUM CHLORIDE 0.9 % IJ SOLN
3.0000 mL | Freq: Two times a day (BID) | INTRAMUSCULAR | Status: DC
  Administered 2014-05-11: 3 mL via INTRAVENOUS

## 2014-05-11 MED ORDER — DEXTROSE 5 % IV SOLN
1.0000 g | INTRAVENOUS | Status: DC
  Administered 2014-05-12: 1 g via INTRAVENOUS
  Filled 2014-05-11 (×4): qty 10

## 2014-05-11 MED ORDER — ONDANSETRON HCL 4 MG/2ML IJ SOLN: 4.0000 mg | Freq: Four times a day (QID) | INTRAMUSCULAR | Status: DC | PRN

## 2014-05-11 MED ORDER — INSULIN ASPART 100 UNIT/ML ~~LOC~~ SOLN
0.0000 [IU] | SUBCUTANEOUS | Status: DC
  Administered 2014-05-12: 3 [IU] via SUBCUTANEOUS

## 2014-05-11 MED ORDER — HEPARIN SODIUM (PORCINE) 5000 UNIT/ML IJ SOLN
5000.0000 [IU] | Freq: Three times a day (TID) | INTRAMUSCULAR | Status: DC
Start: 2014-05-11 — End: 2014-05-12
  Administered 2014-05-11 – 2014-05-12 (×4): 5000 [IU] via SUBCUTANEOUS
  Filled 2014-05-11 (×5): qty 1

## 2014-05-11 MED ORDER — DEXTROSE 50 % IV SOLN
INTRAVENOUS | Status: AC
  Filled 2014-05-11: qty 50

## 2014-05-11 MED ORDER — ASPIRIN EC 81 MG PO TBEC
81.0000 mg | DELAYED_RELEASE_TABLET | Freq: Every day | ORAL | Status: DC
  Administered 2014-05-11 – 2014-05-12 (×2): 81 mg via ORAL
  Filled 2014-05-11 (×2): qty 1

## 2014-05-11 MED ORDER — GLIMEPIRIDE 2 MG PO TABS
8.0000 mg | ORAL_TABLET | Freq: Every day | ORAL | Status: DC
  Administered 2014-05-11: 8 mg via ORAL
  Filled 2014-05-11: qty 4

## 2014-05-11 MED ORDER — OSELTAMIVIR PHOSPHATE 75 MG PO CAPS
75.0000 mg | ORAL_CAPSULE | Freq: Two times a day (BID) | ORAL | Status: DC
  Administered 2014-05-11: 75 mg via ORAL
  Filled 2014-05-11: qty 1

## 2014-05-11 MED ORDER — DEXTROSE 5 % IV SOLN
1.0000 g | Freq: Once | INTRAVENOUS | Status: AC
  Administered 2014-05-11: 1 g via INTRAVENOUS
  Filled 2014-05-11: qty 10

## 2014-05-11 MED ORDER — IPRATROPIUM-ALBUTEROL 0.5-2.5 (3) MG/3ML IN SOLN: 3.0000 mL | RESPIRATORY_TRACT | Status: DC | PRN

## 2014-05-11 MED ORDER — SODIUM CHLORIDE 0.9 % IV BOLUS (SEPSIS)
500.0000 mL | Freq: Once | INTRAVENOUS | Status: AC
  Administered 2014-05-11: 500 mL via INTRAVENOUS

## 2014-05-11 MED ORDER — HYDROCODONE-ACETAMINOPHEN 5-325 MG PO TABS: 1.0000 | ORAL_TABLET | Freq: Four times a day (QID) | ORAL | Status: DC | PRN

## 2014-05-11 MED ORDER — FENOFIBRATE 160 MG PO TABS
160.0000 mg | ORAL_TABLET | Freq: Every day | ORAL | Status: DC
Start: 2014-05-11 — End: 2014-05-12
  Administered 2014-05-11 – 2014-05-12 (×2): 160 mg via ORAL
  Filled 2014-05-11 (×2): qty 1

## 2014-05-11 MED ORDER — DEXTROSE 50 % IV SOLN
INTRAVENOUS | Status: AC
  Administered 2014-05-12: 25 mL
  Filled 2014-05-11: qty 50

## 2014-05-11 MED ORDER — ONDANSETRON HCL 4 MG PO TABS: 4.0000 mg | ORAL_TABLET | Freq: Four times a day (QID) | ORAL | Status: DC | PRN

## 2014-05-11 MED ORDER — SODIUM CHLORIDE 0.9 % IV SOLN
INTRAVENOUS | Status: DC
  Administered 2014-05-11 – 2014-05-12 (×4): via INTRAVENOUS

## 2014-05-11 MED ORDER — PRAVASTATIN SODIUM 40 MG PO TABS
40.0000 mg | ORAL_TABLET | Freq: Every day | ORAL | Status: DC
  Administered 2014-05-11: 40 mg via ORAL
  Filled 2014-05-11: qty 1

## 2014-05-11 MED ORDER — AMITRIPTYLINE HCL 25 MG PO TABS
50.0000 mg | ORAL_TABLET | Freq: Every day | ORAL | Status: DC
  Filled 2014-05-11: qty 2

## 2014-05-11 MED ORDER — DEXTROSE 50 % IV SOLN: 25.0000 mL | Freq: Once | INTRAVENOUS | Status: DC

## 2014-05-11 MED ORDER — OSELTAMIVIR PHOSPHATE 30 MG PO CAPS
30.0000 mg | ORAL_CAPSULE | Freq: Two times a day (BID) | ORAL | Status: DC
  Administered 2014-05-11 – 2014-05-12 (×2): 30 mg via ORAL
  Filled 2014-05-11 (×2): qty 1

## 2014-05-11 MED ORDER — ONDANSETRON HCL 4 MG/2ML IJ SOLN
4.0000 mg | Freq: Once | INTRAMUSCULAR | Status: AC
  Administered 2014-05-11: 4 mg via INTRAVENOUS
  Filled 2014-05-11: qty 2

## 2014-05-11 MED ORDER — PNEUMOCOCCAL VAC POLYVALENT 25 MCG/0.5ML IJ INJ
0.5000 mL | INJECTION | INTRAMUSCULAR | Status: AC
  Administered 2014-05-12: 0.5 mL via INTRAMUSCULAR
  Filled 2014-05-11: qty 0.5

## 2014-05-11 MED ORDER — IPRATROPIUM-ALBUTEROL 0.5-2.5 (3) MG/3ML IN SOLN
3.0000 mL | RESPIRATORY_TRACT | Status: DC
  Administered 2014-05-11 (×3): 3 mL via RESPIRATORY_TRACT
  Filled 2014-05-11 (×2): qty 3

## 2014-05-11 MED ORDER — INFLUENZA VAC SPLIT QUAD 0.5 ML IM SUSY
0.5000 mL | PREFILLED_SYRINGE | INTRAMUSCULAR | Status: AC
  Administered 2014-05-12: 0.5 mL via INTRAMUSCULAR
  Filled 2014-05-11: qty 0.5

## 2014-05-11 MED ORDER — DEXTROSE 50 % IV SOLN
25.0000 mL | Freq: Once | INTRAVENOUS | Status: AC
  Administered 2014-05-11: 25 mL via INTRAVENOUS

## 2014-05-11 MED ORDER — ACETAMINOPHEN 325 MG PO TABS
650.0000 mg | ORAL_TABLET | Freq: Once | ORAL | Status: AC
  Administered 2014-05-11: 650 mg via ORAL
  Filled 2014-05-11: qty 2

## 2014-05-11 MED ORDER — CEFTRIAXONE SODIUM IN DEXTROSE 20 MG/ML IV SOLN
1.0000 g | INTRAVENOUS | Status: DC
  Filled 2014-05-11: qty 50

## 2014-05-11 MED ORDER — BENAZEPRIL HCL 10 MG PO TABS
20.0000 mg | ORAL_TABLET | Freq: Every day | ORAL | Status: DC
Start: 2014-05-11 — End: 2014-05-12
  Administered 2014-05-11 – 2014-05-12 (×2): 20 mg via ORAL
  Filled 2014-05-11 (×2): qty 2

## 2014-05-11 NOTE — Progress Notes (Signed)
UR chart review completed.  

## 2014-05-11 NOTE — Progress Notes (Signed)
Pt's CBG at lunch was 66.  Patient given regular ginger ale.  Will reassess CBG in 15-30 min, and continue to monitor patient.

## 2014-05-11 NOTE — Progress Notes (Signed)
Patient arrived to unit and upon assessment RN found blood glucose level to be 58. Hypoglycemic protocol was set in place and patient was given 25mL of dextrose 50% IV. Patient said she didn't think she could stomach drinking juice because she hadn't had an appetite since she became ill. When blood sugar was rechecked it had improved to 146.

## 2014-05-11 NOTE — Progress Notes (Signed)
Inpatient Diabetes Program Recommendations  AACE/ADA: New Consensus Statement on Inpatient Glycemic Control (2013)  Target Ranges:  Prepandial:   less than 140 mg/dL      Peak postprandial:   less than 180 mg/dL (1-2 hours)      Critically ill patients:  140 - 180 mg/dL   Results for Linda Patrick, Linda Patrick (MRN [id number]) as of 05/11/2014 09:18  Ref. Range 05/11/2014 02:35 05/11/2014 05:07 05/11/2014 05:51 05/11/2014 07:55  Glucose-Capillary Latest Range: 70-99 mg/dL 69 (L) 58 (L) 846146 (H) 79    Reason for Visit: ? Influenza, + UTI  Diabetes history: DM2 Outpatient Diabetes medications: Metformin 1,000mg  Daily, Glimepiride 8 mg Daily Current orders for Inpatient glycemic control: Glimepiride 8 mg Daily, Novolog 0-15 units Q4hrs  Inpatient Diabetes Program Recommendations Oral Agents: Patient with poor appetite at home. Patient had hypoglycemia last pm. Patient had a glucose in the 70's this am. If she continues to have a poor appetite she is at risk for hypoglycemia with the Glimepiride ordered. If patient has more hypoglycemia consider discontinuing Glimepiride and just use correction scale.  Thanks,  Christena DeemShannon Kelly Ranieri RN, MSN, [facility] Inpatient Diabetes Coordinator Team Pager [phone]

## 2014-05-11 NOTE — ED Provider Notes (Signed)
CSN: [id number]     Arrival date & time 05/11/14  0116 History   First MD Initiated Contact with Patient 05/11/14 0126     Chief Complaint  Patient presents with  . Emesis  . Diarrhea     (Consider location/radiation/quality/duration/timing/severity/associated sxs/prior Treatment) Patient is a 69 y.o. female presenting with vomiting and diarrhea. The history is provided by the patient and a relative.  Emesis Associated symptoms: chills and diarrhea   Associated symptoms: no abdominal pain and no headaches   Diarrhea Associated symptoms: chills, fever and vomiting   Associated symptoms: no abdominal pain and no headaches    patient was feeling well until 2 days ago when she started with bodyaches. Today started the with fever chills sore throat and nasal congestion nausea vomiting and diarrhea. For episodes of diarrhea for episodes of vomiting no blood in either one. Patient did not have her flu shot. No significant abdominal pain. Denies rash denies any history of bleeding easily. Patient did not have her flu shot.  Past Medical History  Diagnosis Date  . Hypertension   . Diabetes mellitus   . Hyperlipemia   . Migraine    Past Surgical History  Procedure Laterality Date  . Abdominal hysterectomy    . Slt laser application Left 04/12/2012    Procedure: SLT LASER APPLICATION;  Surgeon: Susa Simmondsarroll F Haines, MD;  Location: [facility];  Service: Ophthalmology;  Laterality: Left;   No family history on file. History  Substance Use Topics  . Smoking status: Never Smoker   . Smokeless tobacco: Not on file  . Alcohol Use: No   OB History    No data available     Review of Systems  Constitutional: Positive for fever, chills and appetite change.  HENT: Positive for congestion and ear pain.   Eyes: Negative for redness.  Respiratory: Negative for shortness of breath.   Cardiovascular: Negative for chest pain.  Gastrointestinal: Positive for nausea, vomiting and diarrhea. Negative for  abdominal pain.  Genitourinary: Negative for dysuria.  Skin: Negative for rash.  Neurological: Negative for headaches.      Allergies  Bactrim; Nitrofuran derivatives; Nubain; Pyridium; and Vistaril  Home Medications   Prior to Admission medications   Medication Sig Start Date End Date Taking? Authorizing Provider  amLODipine (NORVASC) 5 MG tablet Take 5 mg by mouth daily.   Yes Historical Provider, MD  aspirin EC 81 MG tablet Take 81 mg by mouth daily.     Yes Historical Provider, MD  benazepril (LOTENSIN) 20 MG tablet Take 20 mg by mouth daily.     Yes Historical Provider, MD  Cholecalciferol (VITAMIN D-3) 5000 UNITS TABS Take 1 tablet by mouth 2 (two) times daily.     Yes Historical Provider, MD  fenofibrate 160 MG tablet Take 160 mg by mouth daily.     Yes Historical Provider, MD  glimepiride (AMARYL) 4 MG tablet Take 8 mg by mouth daily before breakfast.     Yes Historical Provider, MD  hydrochlorothiazide (MICROZIDE) 12.5 MG capsule Take 12.5 mg by mouth daily.   Yes Historical Provider, MD  ibuprofen (ADVIL,MOTRIN) 200 MG tablet Take 400 mg by mouth every 6 (six) hours as needed. For pain    Yes Historical Provider, MD  metFORMIN (GLUCOPHAGE) 1000 MG tablet Take 1,000 mg by mouth daily with breakfast.     Yes Historical Provider, MD  pravastatin (PRAVACHOL) 40 MG tablet Take 40 mg by mouth daily.     Yes Historical Provider, MD  amitriptyline (ELAVIL) 50 MG tablet Take 50 mg by mouth at bedtime.      Historical Provider, MD  amoxicillin-clavulanate (AUGMENTIN) 875-125 MG per tablet Take 1 tablet by mouth every 12 (twelve) hours. 07/21/13   Ivery Quale, PA-C  HYDROcodone-acetaminophen (NORCO/VICODIN) 5-325 MG per tablet Take 1 tablet by mouth every 6 (six) hours as needed for moderate pain. 07/21/13   Ivery Quale, PA-C  olmesartan-hydrochlorothiazide (BENICAR HCT) 40-12.5 MG per tablet Take 1 tablet by mouth daily.      Historical Provider, MD   BP 153/76 mmHg  Pulse 106   Temp(Src) 103.1 F (39.5 C) (Oral)  Resp 18  Ht 5\' 6"  (1.676 m)  Wt 140 lb (63.504 kg)  BMI 22.61 kg/m2  SpO2 97% Physical Exam  Constitutional: She is oriented to person, place, and time. She appears well-developed and well-nourished.  HENT:  Head: Normocephalic and atraumatic.  Right Ear: External ear normal.  Left Ear: External ear normal.  Mouth/Throat: No oropharyngeal exudate.  Because membranes slightly dry.  Eyes: Pupils are equal, round, and reactive to light.  Neck: Neck supple.  Cardiovascular: Normal rate, regular rhythm and normal heart sounds.   No murmur heard. Pulmonary/Chest: Effort normal and breath sounds normal. No respiratory distress.  Abdominal: Soft. Bowel sounds are normal. There is no tenderness.  Musculoskeletal: Normal range of motion.  Neurological: She is alert and oriented to person, place, and time. No cranial nerve deficit. She exhibits normal muscle tone. Coordination normal.  Skin: Skin is warm. No rash noted. No erythema.  Nursing note and vitals reviewed.   ED Course  Procedures (including critical care time) Labs Review Labs Reviewed  COMPREHENSIVE METABOLIC PANEL - Abnormal; Notable for the following:    BUN 25 (*)    Creatinine, Ser 1.14 (*)    AST 48 (*)    ALT 39 (*)    Alkaline Phosphatase 36 (*)    GFR calc non Af Amer 48 (*)    GFR calc Af Amer 56 (*)    All other components within normal limits  URINALYSIS, ROUTINE W REFLEX MICROSCOPIC - Abnormal; Notable for the following:    APPearance HAZY (*)    Hgb urine dipstick TRACE (*)    Nitrite POSITIVE (*)    Leukocytes, UA MODERATE (*)    All other components within normal limits  CBC WITH DIFFERENTIAL/PLATELET - Abnormal; Notable for the following:    Neutrophils Relative % 82 (*)    Lymphocytes Relative 8 (*)    Lymphs Abs 0.4 (*)    All other components within normal limits  URINE MICROSCOPIC-ADD ON - Abnormal; Notable for the following:    Squamous Epithelial / LPF MANY  (*)    Bacteria, UA MANY (*)    All other components within normal limits  CBG MONITORING, ED - Abnormal; Notable for the following:    Glucose-Capillary 69 (*)    All other components within normal limits  CULTURE, BLOOD (ROUTINE X 2)  CULTURE, BLOOD (ROUTINE X 2)  URINE CULTURE  CLOSTRIDIUM DIFFICILE BY PCR  INFLUENZA PANEL BY PCR (TYPE A & B, H1N1)  I-STAT CG4 LACTIC ACID, ED   Results for orders placed or performed during the hospital encounter of 05/11/14  Culture, blood (routine x 2)  Result Value Ref Range   Specimen Description BLOOD LEFT HAND    Special Requests      BOTTLES DRAWN AEROBIC AND ANAEROBIC AEB 8CC ANA 6CC   Culture PENDING    Report Status  PENDING   Culture, blood (routine x 2)  Result Value Ref Range   Specimen Description BLOOD LEFT ANTECUBITAL    Special Requests BOTTLES DRAWN AEROBIC AND ANAEROBIC [facility] EACH    Culture PENDING    Report Status PENDING   Comprehensive metabolic panel  Result Value Ref Range   Sodium 141 135 - 145 mmol/L   Potassium 3.5 3.5 - 5.1 mmol/L   Chloride 106 96 - 112 mmol/L   CO2 24 19 - 32 mmol/L   Glucose, Bld 85 70 - 99 mg/dL   BUN 25 (H) 6 - 23 mg/dL   Creatinine, Ser 1.61 (H) 0.50 - 1.10 mg/dL   Calcium 09.6 8.4 - 04.5 mg/dL   Total Protein 8.0 6.0 - 8.3 g/dL   Albumin 4.2 3.5 - 5.2 g/dL   AST 48 (H) 0 - 37 U/L   ALT 39 (H) 0 - 35 U/L   Alkaline Phosphatase 36 (L) 39 - 117 U/L   Total Bilirubin 0.7 0.3 - 1.2 mg/dL   GFR calc non Af Amer 48 (L) >90 mL/min   GFR calc Af Amer 56 (L) >90 mL/min   Anion gap 11 5 - 15  Urinalysis, Routine w reflex microscopic  Result Value Ref Range   Color, Urine YELLOW YELLOW   APPearance HAZY (A) CLEAR   Specific Gravity, Urine 1.020 1.005 - 1.030   pH 8.0 5.0 - 8.0   Glucose, UA NEGATIVE NEGATIVE mg/dL   Hgb urine dipstick TRACE (A) NEGATIVE   Bilirubin Urine NEGATIVE NEGATIVE   Ketones, ur NEGATIVE NEGATIVE mg/dL   Protein, ur NEGATIVE NEGATIVE mg/dL   Urobilinogen, UA 1.0  0.0 - 1.0 mg/dL   Nitrite POSITIVE (A) NEGATIVE   Leukocytes, UA MODERATE (A) NEGATIVE  CBC with Differential/Platelet  Result Value Ref Range   WBC 4.8 4.0 - 10.5 K/uL   RBC 4.64 3.87 - 5.11 MIL/uL   Hemoglobin 13.2 12.0 - 15.0 g/dL   HCT 40.9 81.1 - 91.4 %   MCV 84.5 78.0 - 100.0 fL   MCH 28.4 26.0 - 34.0 pg   MCHC 33.7 30.0 - 36.0 g/dL   RDW 78.2 95.6 - 21.3 %   Platelets 193 150 - 400 K/uL   Neutrophils Relative % 82 (H) 43 - 77 %   Neutro Abs 3.9 1.7 - 7.7 K/uL   Lymphocytes Relative 8 (L) 12 - 46 %   Lymphs Abs 0.4 (L) 0.7 - 4.0 K/uL   Monocytes Relative 10 3 - 12 %   Monocytes Absolute 0.5 0.1 - 1.0 K/uL   Eosinophils Relative 0 0 - 5 %   Eosinophils Absolute 0.0 0.0 - 0.7 K/uL   Basophils Relative 0 0 - 1 %   Basophils Absolute 0.0 0.0 - 0.1 K/uL  Urine microscopic-add on  Result Value Ref Range   Squamous Epithelial / LPF MANY (A) RARE   WBC, UA TOO NUMEROUS TO COUNT <3 WBC/hpf   RBC / HPF 0-2 <3 RBC/hpf   Bacteria, UA MANY (A) RARE  POC CBG, ED  Result Value Ref Range   Glucose-Capillary 69 (L) 70 - 99 mg/dL  I-Stat CG4 Lactic Acid, ED  Result Value Ref Range   Lactic Acid, Venous 1.54 0.5 - 2.0 mmol/L     Imaging Review Dg Chest Port 1 View  05/11/2014   CLINICAL DATA:  Acute onset of fever, weakness, body aches, nausea and vomiting. Initial encounter.  EXAM: PORTABLE CHEST - 1 VIEW  COMPARISON:  Chest radiograph  performed 04/24/2009  FINDINGS: The lungs are well-aerated. Pulmonary vascularity is at the upper limits of normal. There is no evidence of focal opacification, pleural effusion or pneumothorax.  The cardiomediastinal silhouette is within normal limits. Lucency about the aortic knob appears stable and likely reflects the patient's baseline. No acute osseous abnormalities are seen.  IMPRESSION: No acute cardiopulmonary process seen.   Electronically Signed   By: Roanna Raider M.D.   On: 05/11/2014 02:26     EKG Interpretation   Date/Time:  Thursday  May 11 2014 02:50:27 EDT Ventricular Rate:  106 PR Interval:  135 QRS Duration: 80 QT Interval:  306 QTC Calculation: 406 R Axis:   49 Text Interpretation:  Sinus tachycardia Probable left atrial enlargement  Abnormal R-wave progression, early transition No significant change since  last tracing Tachycardia is new Confirmed by Willa Brocks  MD, Galit Urich ([id number])  on 05/11/2014 2:57:57 AM      MDM   Final diagnoses:  Fever  Flu-like symptoms  UTI (lower urinary tract infection)  Dehydration  Diarrhea    Patient will require admission. Patient with flulike illness. Flu confirmation is still pending. Patient with upper respiratory symptoms fever evidence of some dehydration. Nausea vomiting and diarrhea. Also C. difficile testing sent. Patient's lactic acid is not elevated. Currently no evidence of sepsis. However patient is febrile and tachycardic. Patient receiving the fluids. Patient will receive Rocephin for urinary tract infection positive nitrite and urine culture sent blood culture sent as well. Patient will require admission for further hydration and monitoring. Chest x-ray negative for pneumonia. No leukocytosis. Mild elevation in BUN and creatinine.    Vanetta Mulders, MD 05/11/14 [phone]

## 2014-05-11 NOTE — ED Notes (Addendum)
CRITICAL VALUE ALERT  Critical value received:  Positive Influenza Type A  Date of notification:  05/11/14  Time of notification:  0431 hrs  Critical value read back:Yes.    Nurse who received alert:  Y. Ellaree Gear, RN  MD notified (1st page):  Dr. Conley RollsLe  Time of first page: 0431 hrs  Responding MD:  Dr. Conley RollsLe  Time MD responded:  0432 hrs

## 2014-05-11 NOTE — H&P (Signed)
[facility] History and Physical  Linda Patrick GNF:[id number] DOB: 03/29/1945    PCP:   Samuel JesterBUTLER, CYNTHIA, DO   Chief Complaint: coughs, malaise, myalgia, diarrhea....  HPI: Linda CotaJudy M Patrick is an 69 y.o. female lives at home with her husband, with hx of HTN, DM, HLD, migraine, presented to the ER with 3 days hx of viral syndrome including coughs, myalgia, fever, chills, nausea, vomiting and diarrhea.  She denied sorethroat, HA, chest pain, black stool, or localized abdominal pain.  She did not have her flu shot this year.  Evaluation in the ER included a clear CXR, no leukocytosis with WBC of 4K, and UA showed TNTC WBCs with positive nitrite.  She also has slight elevation of BUN and Cr of 1.14.  She was not on any antibiotics, and admitted to having some diarrhea.  Her husband recently had appendectomy, but he had no similar symptoms.  Hospitlist was asked to admit her for viral syndrome, UTI, and volume depletion.   Rewiew of Systems:  Constitutional: No significant weight loss or weight gain Eyes: Negative for eye pain, redness and discharge, diplopia, visual changes, or flashes of light. ENMT: Negative for ear pain, hoarseness, nasal congestion, sinus pressure and sore throat. No headaches; tinnitus, drooling, or problem swallowing. Cardiovascular: Negative for chest pain, palpitations, diaphoresis, dyspnea and peripheral edema. ; No orthopnea, PND Respiratory: Negative for cough, hemoptysis, wheezing and stridor. No pleuritic chestpain. Gastrointestinal: Negative for  constipation, abdominal pain, melena, blood in stool, hematemesis, jaundice and rectal bleeding.    Genitourinary: Negative for frequency, dysuria, incontinence,flank pain and hematuria; Musculoskeletal: Negative for back pain and neck pain. Negative for swelling and trauma.;  Skin: . Negative for pruritus, rash, abrasions, bruising and skin lesion.; ulcerations Neuro: Negative for headache, lightheadedness and neck stiffness.  Negative for altered level of consciousness , altered mental status, extremity weakness, burning feet, involuntary movement, seizure and syncope.  Psych: negative for anxiety, depression, insomnia, tearfulness, panic attacks, hallucinations, paranoia, suicidal or homicidal ideation    Past Medical History  Diagnosis Date  . Hypertension   . Diabetes mellitus   . Hyperlipemia   . Migraine     Past Surgical History  Procedure Laterality Date  . Abdominal hysterectomy    . Slt laser application Left 04/12/2012    Procedure: SLT LASER APPLICATION;  Surgeon: Susa Simmondsarroll F Haines, MD;  Location: [facility];  Service: Ophthalmology;  Laterality: Left;    Medications:  HOME MEDS: Prior to Admission medications   Medication Sig Start Date End Date Taking? Authorizing Provider  amLODipine (NORVASC) 5 MG tablet Take 5 mg by mouth daily.   Yes Historical Provider, MD  aspirin EC 81 MG tablet Take 81 mg by mouth daily.     Yes Historical Provider, MD  benazepril (LOTENSIN) 20 MG tablet Take 20 mg by mouth daily.     Yes Historical Provider, MD  Cholecalciferol (VITAMIN D-3) 5000 UNITS TABS Take 1 tablet by mouth 2 (two) times daily.     Yes Historical Provider, MD  fenofibrate 160 MG tablet Take 160 mg by mouth daily.     Yes Historical Provider, MD  glimepiride (AMARYL) 4 MG tablet Take 8 mg by mouth daily before breakfast.     Yes Historical Provider, MD  hydrochlorothiazide (MICROZIDE) 12.5 MG capsule Take 12.5 mg by mouth daily.   Yes Historical Provider, MD  ibuprofen (ADVIL,MOTRIN) 200 MG tablet Take 400 mg by mouth every 6 (six) hours as needed. For pain    Yes  Historical Provider, MD  metFORMIN (GLUCOPHAGE) 1000 MG tablet Take 1,000 mg by mouth daily with breakfast.     Yes Historical Provider, MD  pravastatin (PRAVACHOL) 40 MG tablet Take 40 mg by mouth daily.     Yes Historical Provider, MD  amitriptyline (ELAVIL) 50 MG tablet Take 50 mg by mouth at bedtime.      Historical Provider, MD   amoxicillin-clavulanate (AUGMENTIN) 875-125 MG per tablet Take 1 tablet by mouth every 12 (twelve) hours. 07/21/13   Ivery Quale, PA-C  HYDROcodone-acetaminophen (NORCO/VICODIN) 5-325 MG per tablet Take 1 tablet by mouth every 6 (six) hours as needed for moderate pain. 07/21/13   Ivery Quale, PA-C  olmesartan-hydrochlorothiazide (BENICAR HCT) 40-12.5 MG per tablet Take 1 tablet by mouth daily.      Historical Provider, MD     Allergies:  Allergies  Allergen Reactions  . Bactrim [Sulfamethoxazole-Trimethoprim]   . Nitrofuran Derivatives Other (See Comments)    unknown  . Nubain [Nalbuphine Hcl] Other (See Comments)    unknown  . Pyridium [Phenazopyridine Hcl]   . Vistaril [Hydroxyzine Pamoate] Other (See Comments)    unknown    Social History:   reports that she has never smoked. She does not have any smokeless tobacco history on file. She reports that she does not drink alcohol or use illicit drugs.  Family History: No family history on file.   Physical Exam: Filed Vitals:   05/11/14 0133 05/11/14 0251 05/11/14 0317  BP: 151/76 153/76   Pulse: 114 106   Temp: 100.6 F (38.1 C) 102.9 F (39.4 C) 103.1 F (39.5 C)  TempSrc: Oral Oral Oral  Resp: 24 18   Height:  (1.676 m)    Weight: 63.504 kg (140 lb)    SpO2: 100% 97%    Blood pressure 153/76, pulse 106, temperature 103.1 F (39.5 C), temperature source Oral, resp. rate 18, height  (1.676 m), weight 63.504 kg (140 lb), SpO2 97 %.  GEN:  Pleasant  patient lying in the stretcher in no acute distress; cooperative with exam. PSYCH:  alert and oriented x4; does not appear anxious or depressed; affect is appropriate. HEENT: Mucous membranes pink and anicteric; PERRLA; EOM intact; no cervical lymphadenopathy nor thyromegaly or carotid bruit; no JVD; There were no stridor. Neck is very supple. Breasts:: Not examined CHEST WALL: No tenderness CHEST: Normal respiration, with wheezing bilaterally (mild).  HEART:  Regular rate and rhythm.  There are no murmur, rub, or gallops.   BACK: No kyphosis or scoliosis; no CVA tenderness ABDOMEN: soft and non-tender; no masses, no organomegaly, normal abdominal bowel sounds; no pannus; no intertriginous candida. There is no rebound and no distention. Rectal Exam: Not done EXTREMITIES: No bone or joint deformity; age-appropriate arthropathy of the hands and knees; no edema; no ulcerations.  There is no calf tenderness. Genitalia: not examined PULSES: 2+ and symmetric SKIN: Normal hydration no rash or ulceration CNS: Cranial nerves 2-12 grossly intact no focal lateralizing neurologic deficit.  Speech is fluent; uvula elevated with phonation, facial symmetry and tongue midline. DTR are normal bilaterally, cerebella exam is intact, barbinski is negative and strengths are equaled bilaterally.  No sensory loss.   Labs on Admission:  Basic Metabolic Panel:  Recent Labs Lab 05/11/14 0200  NA 141  K 3.5  CL 106  CO2 24  GLUCOSE 85  BUN 25*  CREATININE 1.14*  CALCIUM 10.0   Liver Function Tests:  Recent Labs Lab 05/11/14 0200  AST 48*  ALT 39*  ALKPHOS 36*  BILITOT 0.7  PROT 8.0  ALBUMIN 4.2   No results for input(s): LIPASE, AMYLASE in the last 168 hours. No results for input(s): AMMONIA in the last 168 hours. CBC:  Recent Labs Lab 05/11/14 0200  WBC 4.8  NEUTROABS 3.9  HGB 13.2  HCT 39.2  MCV 84.5  PLT 193   Cardiac Enzymes: No results for input(s): CKTOTAL, CKMB, CKMBINDEX, TROPONINI in the last 168 hours.  CBG:  Recent Labs Lab 05/11/14 0235  GLUCAP 69*     Radiological Exams on Admission: Dg Chest Port 1 View  05/11/2014   CLINICAL DATA:  Acute onset of fever, weakness, body aches, nausea and vomiting. Initial encounter.  EXAM: PORTABLE CHEST - 1 VIEW  COMPARISON:  Chest radiograph performed 04/24/2009  FINDINGS: The lungs are well-aerated. Pulmonary vascularity is at the upper limits of normal. There is no evidence of focal  opacification, pleural effusion or pneumothorax.  The cardiomediastinal silhouette is within normal limits. Lucency about the aortic knob appears stable and likely reflects the patient's baseline. No acute osseous abnormalities are seen.  IMPRESSION: No acute cardiopulmonary process seen.   Electronically Signed   By: Roanna Raider M.D.   On: 05/11/2014 02:26   Assessment/Plan Present on Admission:  . Viral syndrome . Hyperlipidemia . UTI (urinary tract infection)  PLAN:  Will admit her for viral syndrome (suspicious for influenza), along with UTI and volume depletion.  Will start her on Tamiflu pending influenza testing.  She will be treated with Rocephin for her UTI.  She will be given IVF, along with symptomatic treatment.  Stool will be sent for C diff, though I have low clinical suspicion for it.  For her DM, will hold her Metformin, but continue her on Amaryl, and use insulin sliding scale.   I think she will be able to eat, so will give her carb modified diet.  She is stable, full code, and will be admtited to hospitalist service.  Thank  You for allowing me to participate in her care.   Other plans as per orders.  Code Status: FULL Unk Lightning, MD. [facility] Pager [phone] 7pm to 7am.  05/11/2014, 4:06 AM

## 2014-05-11 NOTE — ED Notes (Signed)
Vomiting and diarrhea for several days

## 2014-05-11 NOTE — Progress Notes (Signed)
[facility] PROGRESS NOTE  Linda Patrick BJY:[id number] DOB: 07/26/1945 DOA: 05/11/2014 PCP: Samuel JesterBUTLER, CYNTHIA, DO  Assessment/Plan: 1. Influenza 1. Pos for Influenza A 2. Started tamiflu 3. Cont IVF and supportive care 4. Tmax of 103.1 this AM 2. UTI with sepsis 1. Urine cx pending 2. UA pos for nitrites, leukocytes, many bacteria 3. On empiric rocephin 4. No leukocytosis 5. Febrile earlier this AM with Tmax of 103.1 and tachycardia 3. Dehydration 1. Cont on IVF as tolerated 2. Encourage PO as tolerated 4. Diarrhea 1. Cdiff is neg 2. Likely secondary to influenza 5. DM2 1. Pt with limited PO intake related to nausea with resultant hypoglycemic bouts 2. Have d/c'd all PO diabetic meds 3. Cont on SSI alone for now 6. HTN 1. BP stable, suboptimal although pt is acutely ill 7. DVT prophylaxis 1. Heparin subQ  Code Status: Full Family Communication: Pt in room  Disposition Plan: Pending   Consultants:    Procedures:    Antibiotics:  Rocephin 3/17>>> (indicate start date, and stop date if known)  HPI/Subjective: No acute events noted overnight  Objective: Filed Vitals:   05/11/14 0459 05/11/14 0727 05/11/14 0756 05/11/14 1137  BP: 135/74     Pulse: 105     Temp: 100.3 F (37.9 C)  99.3 F (37.4 C)   TempSrc: Oral     Resp: 20     Height: 5\' 6"  (1.676 m)     Weight: 67.132 kg (148 lb)     SpO2: 98% 97%  97%    Intake/Output Summary (Last 24 hours) at 05/11/14 1437 Last data filed at 05/11/14 0757  Gross per 24 hour  Intake    110 ml  Output    500 ml  Net   -390 ml   Filed Weights   05/11/14 0133 05/11/14 0459  Weight: 63.504 kg (140 lb) 67.132 kg (148 lb)    Exam:   General:  Awake, in mild discomfort  Cardiovascular: tachycardia, s1, s2  Respiratory: normal resp effort, no wheezing  Abdomen: soft,nondistended  Musculoskeletal: perfused, no clubbing   Data Reviewed: Basic Metabolic Panel:  Recent Labs Lab 05/11/14 0200  05/11/14 0539  NA 141 138  K 3.5 3.0*  CL 106 108  CO2 24 21  GLUCOSE 85 170*  BUN 25* 22  CREATININE 1.14* 1.01  CALCIUM 10.0 8.6   Liver Function Tests:  Recent Labs Lab 05/11/14 0200  AST 48*  ALT 39*  ALKPHOS 36*  BILITOT 0.7  PROT 8.0  ALBUMIN 4.2   No results for input(s): LIPASE, AMYLASE in the last 168 hours. No results for input(s): AMMONIA in the last 168 hours. CBC:  Recent Labs Lab 05/11/14 0200 05/11/14 0539  WBC 4.8 4.5  NEUTROABS 3.9  --   HGB 13.2 10.8*  HCT 39.2 33.1*  MCV 84.5 84.7  PLT 193 168   Cardiac Enzymes: No results for input(s): CKTOTAL, CKMB, CKMBINDEX, TROPONINI in the last 168 hours. BNP (last 3 results) No results for input(s): BNP in the last 8760 hours.  ProBNP (last 3 results) No results for input(s): PROBNP in the last 8760 hours.  CBG:  Recent Labs Lab 05/11/14 0507 05/11/14 0551 05/11/14 0755 05/11/14 1132 05/11/14 1201  GLUCAP 58* 146* 79 66* 76    Recent Results (from the past 240 hour(s))  Culture, blood (routine x 2)     Status: None (Preliminary result)   Collection Time: 05/11/14  2:45 AM  Result Value Ref Range Status   Specimen  Description BLOOD LEFT ANTECUBITAL  Final   Special Requests BOTTLES DRAWN AEROBIC AND ANAEROBIC [facility] EACH  Final   Culture PENDING  Incomplete   Report Status PENDING  Incomplete  Culture, blood (routine x 2)     Status: None (Preliminary result)   Collection Time: 05/11/14  2:50 AM  Result Value Ref Range Status   Specimen Description BLOOD LEFT HAND  Final   Special Requests   Final    BOTTLES DRAWN AEROBIC AND ANAEROBIC AEB 8CC ANA 6CC   Culture PENDING  Incomplete   Report Status PENDING  Incomplete  Clostridium Difficile by PCR     Status: None   Collection Time: 05/11/14  9:13 AM  Result Value Ref Range Status   C difficile by pcr NEGATIVE NEGATIVE Final     Studies: Dg Chest Port 1 View  05/11/2014   CLINICAL DATA:  Acute onset of fever, weakness, body aches,  nausea and vomiting. Initial encounter.  EXAM: PORTABLE CHEST - 1 VIEW  COMPARISON:  Chest radiograph performed 04/24/2009  FINDINGS: The lungs are well-aerated. Pulmonary vascularity is at the upper limits of normal. There is no evidence of focal opacification, pleural effusion or pneumothorax.  The cardiomediastinal silhouette is within normal limits. Lucency about the aortic knob appears stable and likely reflects the patient's baseline. No acute osseous abnormalities are seen.  IMPRESSION: No acute cardiopulmonary process seen.   Electronically Signed   By: Roanna Raider M.D.   On: 05/11/2014 02:26    Scheduled Meds: . amitriptyline  50 mg Oral QHS  . amLODipine  5 mg Oral Daily  . aspirin EC  81 mg Oral Daily  . benazepril  20 mg Oral Daily  . [START ON 05/12/2014] cefTRIAXone (ROCEPHIN)  IV  1 g Intravenous Q24H  . fenofibrate  160 mg Oral Daily  . heparin  5,000 Units Subcutaneous 3 times per day  . [START ON 05/12/2014] Influenza vac split quadrivalent PF  0.5 mL Intramuscular Tomorrow-1000  . insulin aspart  0-15 Units Subcutaneous 6 times per day  . ipratropium-albuterol  3 mL Nebulization Q4H  . oseltamivir  30 mg Oral BID  . [START ON 05/12/2014] pneumococcal 23 valent vaccine  0.5 mL Intramuscular Tomorrow-1000  . pravastatin  40 mg Oral q1800  . sodium chloride  3 mL Intravenous Q12H   Continuous Infusions: . sodium chloride 100 mL/hr at 05/11/14 7829    Principal Problem:   Viral syndrome Active Problems:   DM (diabetes mellitus)   Hyperlipidemia   UTI (urinary tract infection)  CHIU, STEPHEN K  [facility] Pager [phone]. If 7PM-7AM, please contact night-coverage at [email or web address], password [facility] 05/11/2014, 2:37 PM  LOS: 0 days

## 2014-05-11 NOTE — Care Management Note (Addendum)
    Page 1 of 1   05/12/2014     11:24:41 AM CARE MANAGEMENT NOTE 05/12/2014  Patient:  Linda Patrick,Linda Patrick   Account Number:  [id number]  Date Initiated:  05/11/2014  Documentation initiated by:  Sharrie RothmanBLACKWELL,Dorthula Bier C  Subjective/Objective Assessment:   Pt admitted from home with UTI and viral syndrome. Pt lives with her husband and will return home at discharge. Pt is independent with ADL's.     Action/Plan:   No CM needs noted. Anticipate discharge within 48 hours.   Anticipated DC Date:  05/13/2014   Anticipated DC Plan:  HOME/SELF CARE      DC Planning Services  CM consult      Choice offered to / List presented to:             Status of service:  Completed, signed off Medicare Important Message given?  NA - LOS <3 / Initial given by admissions (If response is "NO", the following Medicare IM given date fields will be blank) Date Medicare IM given:   Medicare IM given by:   Date Additional Medicare IM given:   Additional Medicare IM given by:    Discharge Disposition:  HOME/SELF CARE  Per UR Regulation:    If discussed at Long Length of Stay Meetings, dates discussed:    Comments:  05/12/14 1120 Arlyss Queenammy Belle Charlie, RN BSN CM Pt discharged home today. No CM needs noted.  05/11/14 1155 Arlyss Queenammy Briget Shaheed, RN BSN CM

## 2014-05-11 NOTE — Progress Notes (Signed)
Hypoglycemic Event  CBG: 47 mg/dL  Treatment: 15 GM carbohydrate snack  Symptoms: None  Follow-up CBG: Time:1753 CBG Result: 89 mg/dL  Possible Reasons for Event: Inadequate meal intake  Comments/MD notified:None    Linda Patrick D  Remember to initiate Hypoglycemia Order Set & complete

## 2014-05-12 DIAGNOSIS — J09X2 Influenza due to identified novel influenza A virus with other respiratory manifestations: Secondary | ICD-10-CM | POA: Diagnosis not present

## 2014-05-12 LAB — GLUCOSE, CAPILLARY
GLUCOSE-CAPILLARY: 111 mg/dL — AB (ref 70–99)
GLUCOSE-CAPILLARY: 135 mg/dL — AB (ref 70–99)
GLUCOSE-CAPILLARY: 80 mg/dL (ref 70–99)
GLUCOSE-CAPILLARY: 97 mg/dL (ref 70–99)
Glucose-Capillary: 44 mg/dL — CL (ref 70–99)
Glucose-Capillary: 51 mg/dL — ABNORMAL LOW (ref 70–99)
Glucose-Capillary: 164 mg/dL — ABNORMAL HIGH (ref 70–99)

## 2014-05-12 LAB — CBC
HEMATOCRIT: 33.8 % — AB (ref 36.0–46.0)
Hemoglobin: 11 g/dL — ABNORMAL LOW (ref 12.0–15.0)
MCH: 27.5 pg (ref 26.0–34.0)
MCHC: 32.5 g/dL (ref 30.0–36.0)
MCV: 84.5 fL (ref 78.0–100.0)
Platelets: 182 10*3/uL (ref 150–400)
RBC: 4 MIL/uL (ref 3.87–5.11)
RDW: 14.5 % (ref 11.5–15.5)
WBC: 3.9 10*3/uL — AB (ref 4.0–10.5)

## 2014-05-12 LAB — BASIC METABOLIC PANEL
ANION GAP: 8 (ref 5–15)
BUN: 14 mg/dL (ref 6–23)
CALCIUM: 8.5 mg/dL (ref 8.4–10.5)
CO2: 25 mmol/L (ref 19–32)
CREATININE: 0.82 mg/dL (ref 0.50–1.10)
Chloride: 107 mmol/L (ref 96–112)
GFR, EST AFRICAN AMERICAN: 83 mL/min — AB (ref >90)
GFR, EST NON AFRICAN AMERICAN: 72 mL/min — AB (ref >90)
Glucose, Bld: 127 mg/dL — ABNORMAL HIGH (ref 70–99)
Potassium: 3.1 mmol/L — ABNORMAL LOW (ref 3.5–5.1)
Sodium: 140 mmol/L (ref 135–145)

## 2014-05-12 LAB — MAGNESIUM: Magnesium: 1.4 mg/dL — ABNORMAL LOW (ref 1.5–2.5)

## 2014-05-12 MED ORDER — MAGNESIUM SULFATE 2 GM/50ML IV SOLN
2.0000 g | Freq: Once | INTRAVENOUS | Status: AC
  Administered 2014-05-12: 2 g via INTRAVENOUS
  Filled 2014-05-12: qty 50

## 2014-05-12 MED ORDER — POTASSIUM CHLORIDE CRYS ER 20 MEQ PO TBCR
40.0000 meq | EXTENDED_RELEASE_TABLET | Freq: Two times a day (BID) | ORAL | Status: DC
  Administered 2014-05-12: 40 meq via ORAL
  Filled 2014-05-12: qty 2

## 2014-05-12 MED ORDER — DEXTROSE 50 % IV SOLN
25.0000 mL | Freq: Once | INTRAVENOUS | Status: AC
  Administered 2014-05-12: 25 mL via INTRAVENOUS

## 2014-05-12 MED ORDER — CEFUROXIME AXETIL 250 MG PO TABS: 250.0000 mg | ORAL_TABLET | Freq: Two times a day (BID) | ORAL | Status: DC

## 2014-05-12 MED ORDER — DEXTROSE-NACL 5-0.9 % IV SOLN
INTRAVENOUS | Status: DC
  Administered 2014-05-12: 10:00:00 via INTRAVENOUS

## 2014-05-12 MED ORDER — OSELTAMIVIR PHOSPHATE 75 MG PO CAPS: 75.0000 mg | ORAL_CAPSULE | Freq: Two times a day (BID) | ORAL | Status: DC

## 2014-05-12 NOTE — Discharge Summary (Signed)
Physician Discharge Summary  Linda Patrick ZOX:[id number] DOB: 1945/05/10 DOA: 05/11/2014  PCP: Samuel Jester, DO  Admit date: 05/11/2014 Discharge date: 05/12/2014  Time spent: 20 minutes  Recommendations for Outpatient Follow-up:  1. Follow up with PCP in 1-2 weeks 2. Would repeat bmet and magnesium level in one week to follow up on hypokalemia and hypomagnesemia 3. Recommend pt's husband be seen by PCP for possible prophylactic tamiflu  Discharge Diagnoses:  Principal Problem:   Viral syndrome Active Problems:   DM (diabetes mellitus)   Hyperlipidemia   UTI (urinary tract infection)   Dehydration   Diarrhea   Influenza with respiratory manifestations   Discharge Condition: Improved  Diet recommendation: Diabetic  Filed Weights   05/11/14 0133 05/11/14 0459  Weight: 63.504 kg (140 lb) 67.132 kg (148 lb)    History of present illness:  Please see admit h and p from 3/17 for details. Briefly, pt presents with cough, malaise, diarrhea, found to be influenza A positive as well as having a UTI. The patient was admitted for further work up.  Hospital Course:  1. Influenza 1. Pos for Influenza A 2. Started tamiflu on 3/17 3. Patient was cont on IVF and supportive care 4. Tmax of 103.1 on 3/17, afebrile afterwards 2. UTI with sepsis 1. Urine cx pending 2. UA pos for nitrites, leukocytes, many bacteria 3. On empiric rocephin 4. No leukocytosis 5. Febrile on 3/17 with Tmax of 103.1 and tachycardia, fevers resolved 3. Dehydration 1. Cont on IVF as tolerated 2. Encourage PO as tolerated 4. Diarrhea 1. Cdiff is neg 2. Likely secondary to influenza 3. Resolved 5. Hypokalemia 1. Replaced 6. Hypomagnesemia 1. Likely secondary to diarrhea 2. Replaced 7. DM2 1. Pt with limited PO intake related to nausea with resultant hypoglycemic bouts 2. Was continued on SSI coverage while inpatient 8. HTN 1. BP remained stable, suboptimal although pt is acutely ill 9. DVT  prophylaxis 1. Heparin subQ  Consultations:  None  Discharge Exam: Filed Vitals:   05/11/14 1137 05/11/14 1610 05/11/14 1642 05/11/14 2003  BP:   127/62 131/59  Pulse:   86 75  Temp:   98.3 F (36.8 C) 97.8 F (36.6 C)  TempSrc:   Oral Oral  Resp:   20 20  Height:      Weight:      SpO2: 97% 98% 100% 100%    General: awake, in nad Cardiovascular: regular, s1, s2 Respiratory: normal resp effort, no wheezing  Discharge Instructions     Medication List    TAKE these medications        amitriptyline 50 MG tablet  Commonly known as:  ELAVIL  Take 50 mg by mouth at bedtime.     amLODipine 5 MG tablet  Commonly known as:  NORVASC  Take 5 mg by mouth daily.     aspirin EC 81 MG tablet  Take 81 mg by mouth daily.     benazepril 20 MG tablet  Commonly known as:  LOTENSIN  Take 20 mg by mouth daily.     cefUROXime 250 MG tablet  Commonly known as:  CEFTIN  Take 1 tablet (250 mg total) by mouth 2 (two) times daily with a meal.     fenofibrate 160 MG tablet  Take 160 mg by mouth daily.     glimepiride 4 MG tablet  Commonly known as:  AMARYL  Take 8 mg by mouth daily before breakfast.     hydrochlorothiazide 12.5 MG capsule  Commonly known as:  MICROZIDE  Take 12.5 mg by mouth daily.     ibuprofen 200 MG tablet  Commonly known as:  ADVIL,MOTRIN  Take 400 mg by mouth every 6 (six) hours as needed. For pain     metFORMIN 1000 MG tablet  Commonly known as:  GLUCOPHAGE  Take 1,000 mg by mouth daily with breakfast.     oseltamivir 75 MG capsule  Commonly known as:  TAMIFLU  Take 1 capsule (75 mg total) by mouth 2 (two) times daily.     pravastatin 40 MG tablet  Commonly known as:  PRAVACHOL  Take 40 mg by mouth daily.     Vitamin D-3 5000 UNITS Tabs  Take 1 tablet by mouth 2 (two) times daily.       Allergies  Allergen Reactions  . Bactrim [Sulfamethoxazole-Trimethoprim]   . Nitrofuran Derivatives Other (See Comments)    unknown  . Nubain  [Nalbuphine Hcl] Other (See Comments)    unknown  . Pyridium [Phenazopyridine Hcl]   . Vistaril [Hydroxyzine Pamoate] Other (See Comments)    unknown   Follow-up Information    Follow up with BUTLER, CYNTHIA, DO On 05/17/2014.   Why:  at 10:30 am   Contact information:   [address] [place] [address] [phone]        The results of significant diagnostics from this hospitalization (including imaging, microbiology, ancillary and laboratory) are listed below for reference.    Significant Diagnostic Studies: Dg Chest Port 1 View  05/11/2014   CLINICAL DATA:  Acute onset of fever, weakness, body aches, nausea and vomiting. Initial encounter.  EXAM: PORTABLE CHEST - 1 VIEW  COMPARISON:  Chest radiograph performed 04/24/2009  FINDINGS: The lungs are well-aerated. Pulmonary vascularity is at the upper limits of normal. There is no evidence of focal opacification, pleural effusion or pneumothorax.  The cardiomediastinal silhouette is within normal limits. Lucency about the aortic knob appears stable and likely reflects the patient's baseline. No acute osseous abnormalities are seen.  IMPRESSION: No acute cardiopulmonary process seen.   Electronically Signed   By: Roanna Raider M.D.   On: 05/11/2014 02:26    Microbiology: Recent Results (from the past 240 hour(s))  Culture, blood (routine x 2)     Status: None (Preliminary result)   Collection Time: 05/11/14  2:45 AM  Result Value Ref Range Status   Specimen Description BLOOD LEFT ANTECUBITAL  Final   Special Requests BOTTLES DRAWN AEROBIC AND ANAEROBIC 7CC EACH  Final   Culture NO GROWTH 1 DAY  Final   Report Status PENDING  Incomplete  Culture, blood (routine x 2)     Status: None (Preliminary result)   Collection Time: 05/11/14  2:50 AM  Result Value Ref Range Status   Specimen Description BLOOD LEFT HAND  Final   Special Requests   Final    BOTTLES DRAWN AEROBIC AND ANAEROBIC AEB 8CC ANA 6CC   Culture NO GROWTH 1 DAY   Final   Report Status PENDING  Incomplete  Clostridium Difficile by PCR     Status: None   Collection Time: 05/11/14  9:13 AM  Result Value Ref Range Status   C difficile by pcr NEGATIVE NEGATIVE Final     Labs: Basic Metabolic Panel:  Recent Labs Lab 05/11/14 0200 05/11/14 0539 05/12/14 0618  NA 141 138 140  K 3.5 3.0* 3.1*  CL 106 108 107  CO2 GLUCOSE 85 170* 127*  BUN 25* 22 14  CREATININE 1.14*  1.01 0.82  CALCIUM 10.0 8.6 8.5  MG  --   --  1.4*   Liver Function Tests:  Recent Labs Lab 05/11/14 0200  AST 48*  ALT 39*  ALKPHOS 36*  BILITOT 0.7  PROT 8.0  ALBUMIN 4.2   No results for input(s): LIPASE, AMYLASE in the last 168 hours. No results for input(s): AMMONIA in the last 168 hours. CBC:  Recent Labs Lab 05/11/14 0200 05/11/14 0539 05/12/14 0618  WBC 4.8 4.5 3.9*  NEUTROABS 3.9  --   --   HGB 13.2 10.8* 11.0*  HCT 39.2 33.1* 33.8*  MCV 84.5 84.7 84.5  PLT 193 168 182   Cardiac Enzymes: No results for input(s): CKTOTAL, CKMB, CKMBINDEX, TROPONINI in the last 168 hours. BNP: BNP (last 3 results) No results for input(s): BNP in the last 8760 hours.  ProBNP (last 3 results) No results for input(s): PROBNP in the last 8760 hours.  CBG:  Recent Labs Lab 05/12/14 0033 05/12/14 0418 05/12/14 0452 05/12/14 0847 05/12/14 0911  GLUCAP 111* 44* 135* 51* 97    Signed:  Alinna Siple K  [facility] 05/12/2014, 11:31 AM

## 2014-05-13 LAB — URINE CULTURE: Colony Count: >=100000

## 2014-05-16 LAB — CULTURE, BLOOD (ROUTINE X 2)
Culture: NO GROWTH
Culture: NO GROWTH

## 2015-08-06 ENCOUNTER — Emergency Department (HOSPITAL_COMMUNITY)
Admission: EM | Admit: 2015-08-06 | Discharge: 2015-08-06 | Disposition: A | Discharge status: Home / Self Care | Payer: Medicare Other | Attending: Emergency Medicine | Admitting: Emergency Medicine

## 2015-08-06 ENCOUNTER — Encounter (HOSPITAL_COMMUNITY): Payer: Self-pay | Admitting: Emergency Medicine

## 2015-08-06 DIAGNOSIS — I10 Essential (primary) hypertension: Secondary | ICD-10-CM | POA: Diagnosis not present

## 2015-08-06 DIAGNOSIS — Z79899 Other long term (current) drug therapy: Secondary | ICD-10-CM | POA: Insufficient documentation

## 2015-08-06 DIAGNOSIS — N390 Urinary tract infection, site not specified: Secondary | ICD-10-CM

## 2015-08-06 DIAGNOSIS — Z7982 Long term (current) use of aspirin: Secondary | ICD-10-CM | POA: Diagnosis not present

## 2015-08-06 DIAGNOSIS — E785 Hyperlipidemia, unspecified: Secondary | ICD-10-CM | POA: Insufficient documentation

## 2015-08-06 DIAGNOSIS — R112 Nausea with vomiting, unspecified: Secondary | ICD-10-CM | POA: Diagnosis present

## 2015-08-06 DIAGNOSIS — E119 Type 2 diabetes mellitus without complications: Secondary | ICD-10-CM | POA: Insufficient documentation

## 2015-08-06 DIAGNOSIS — R197 Diarrhea, unspecified: Secondary | ICD-10-CM | POA: Diagnosis not present

## 2015-08-06 DIAGNOSIS — Z7984 Long term (current) use of oral hypoglycemic drugs: Secondary | ICD-10-CM | POA: Insufficient documentation

## 2015-08-06 DIAGNOSIS — R1110 Vomiting, unspecified: Secondary | ICD-10-CM

## 2015-08-06 LAB — CBC WITH DIFFERENTIAL/PLATELET
BASOS PCT: 0 %
Basophils Absolute: 0 10*3/uL (ref 0.0–0.1)
Eosinophils Absolute: 0 10*3/uL (ref 0.0–0.7)
Eosinophils Relative: 0 %
HCT: 36.5 % (ref 36.0–46.0)
HEMOGLOBIN: 12.3 g/dL (ref 12.0–15.0)
Lymphocytes Relative: 12 %
Lymphs Abs: 0.7 10*3/uL (ref 0.7–4.0)
MCH: 28.8 pg (ref 26.0–34.0)
MCHC: 33.7 g/dL (ref 30.0–36.0)
MCV: 85.5 fL (ref 78.0–100.0)
Monocytes Absolute: 0.2 10*3/uL (ref 0.1–1.0)
Monocytes Relative: 4 %
NEUTROS PCT: 84 %
Neutro Abs: 4.9 10*3/uL (ref 1.7–7.7)
Platelets: 215 10*3/uL (ref 150–400)
RBC: 4.27 MIL/uL (ref 3.87–5.11)
RDW: 13.8 % (ref 11.5–15.5)
WBC: 5.9 10*3/uL (ref 4.0–10.5)

## 2015-08-06 LAB — URINALYSIS, ROUTINE W REFLEX MICROSCOPIC
Bilirubin Urine: NEGATIVE
GLUCOSE, UA: NEGATIVE mg/dL
HGB URINE DIPSTICK: NEGATIVE
Ketones, ur: NEGATIVE mg/dL
Nitrite: NEGATIVE
PH: 6 (ref 5.0–8.0)
PROTEIN: NEGATIVE mg/dL
Specific Gravity, Urine: 1.01 (ref 1.005–1.030)

## 2015-08-06 LAB — COMPREHENSIVE METABOLIC PANEL
ALK PHOS: 34 U/L — AB (ref 38–126)
ALT: 22 U/L (ref 14–54)
ANION GAP: 9 (ref 5–15)
AST: 24 U/L (ref 15–41)
Albumin: 4.1 g/dL (ref 3.5–5.0)
BUN: 25 mg/dL — ABNORMAL HIGH (ref 6–20)
CALCIUM: 9.4 mg/dL (ref 8.9–10.3)
CO2: 22 mmol/L (ref 22–32)
Chloride: 104 mmol/L (ref 101–111)
Creatinine, Ser: 0.91 mg/dL (ref 0.44–1.00)
GFR calc non Af Amer: >60 mL/min (ref >60)
Glucose, Bld: 195 mg/dL — ABNORMAL HIGH (ref 65–99)
Potassium: 4 mmol/L (ref 3.5–5.1)
SODIUM: 135 mmol/L (ref 135–145)
Total Bilirubin: 0.7 mg/dL (ref 0.3–1.2)
Total Protein: 7.3 g/dL (ref 6.5–8.1)

## 2015-08-06 LAB — URINE MICROSCOPIC-ADD ON: RBC / HPF: NONE SEEN RBC/hpf (ref 0–5)

## 2015-08-06 LAB — CBG MONITORING, ED: Glucose-Capillary: 197 mg/dL — ABNORMAL HIGH (ref 65–99)

## 2015-08-06 MED ORDER — SODIUM CHLORIDE 0.9 % IV BOLUS (SEPSIS)
1000.0000 mL | Freq: Once | INTRAVENOUS | Status: AC
  Administered 2015-08-06: 1000 mL via INTRAVENOUS

## 2015-08-06 MED ORDER — ONDANSETRON HCL 4 MG PO TABS: 4.0000 mg | ORAL_TABLET | Freq: Four times a day (QID) | ORAL | Status: DC

## 2015-08-06 MED ORDER — CEPHALEXIN 500 MG PO CAPS: 500.0000 mg | ORAL_CAPSULE | Freq: Four times a day (QID) | ORAL | Status: DC

## 2015-08-06 MED ORDER — SODIUM CHLORIDE 0.9 % IV SOLN
INTRAVENOUS | Status: DC
Start: 2015-08-06 — End: 2015-08-06

## 2015-08-06 MED ORDER — ONDANSETRON HCL 4 MG/2ML IJ SOLN
4.0000 mg | Freq: Once | INTRAMUSCULAR | Status: AC
  Administered 2015-08-06: 4 mg via INTRAVENOUS
  Filled 2015-08-06: qty 2

## 2015-08-06 NOTE — ED Notes (Addendum)
Pt states she has been vomiting since yesterday and unable to keep her glucose up.  States she was able to drink a Coke before coming to ER.  Denies abd pain but reports some diarrhea.  Actively dry heaving at triage and shaking all over.  States she feels she may have another UTI as she has had these symptoms in the past with one.

## 2015-08-06 NOTE — Discharge Instructions (Signed)
Urinary Tract Infection Urinary tract infections (UTIs) can develop anywhere along your urinary tract. Your urinary tract is your body's drainage system for removing wastes and extra water. Your urinary tract includes two kidneys, two ureters, a bladder, and a urethra. Your kidneys are a pair of bean-shaped organs. Each kidney is about the size of your fist. They are located below your ribs, one on each side of your spine. CAUSES Infections are caused by microbes, which are microscopic organisms, including fungi, viruses, and bacteria. These organisms are so small that they can only be seen through a microscope. Bacteria are the microbes that most commonly cause UTIs. SYMPTOMS  Symptoms of UTIs may vary by age and gender of the patient and by the location of the infection. Symptoms in young women typically include a frequent and intense urge to urinate and a painful, burning feeling in the bladder or urethra during urination. Older women and men are more likely to be tired, shaky, and weak and have muscle aches and abdominal pain. A fever may mean the infection is in your kidneys. Other symptoms of a kidney infection include pain in your back or sides below the ribs, nausea, and vomiting. DIAGNOSIS To diagnose a UTI, your caregiver will ask you about your symptoms. Your caregiver will also ask you to provide a urine sample. The urine sample will be tested for bacteria and white blood cells. White blood cells are made by your body to help fight infection. TREATMENT  Typically, UTIs can be treated with medication. Because most UTIs are caused by a bacterial infection, they usually can be treated with the use of antibiotics. The choice of antibiotic and length of treatment depend on your symptoms and the type of bacteria causing your infection. HOME CARE INSTRUCTIONS  If you were prescribed antibiotics, take them exactly as your caregiver instructs you. Finish the medication even if you feel better after  you have only taken some of the medication.  Drink enough water and fluids to keep your urine clear or pale yellow.  Avoid caffeine, tea, and carbonated beverages. They tend to irritate your bladder.  Empty your bladder often. Avoid holding urine for long periods of time.  Empty your bladder before and after sexual intercourse.  After a bowel movement, women should cleanse from front to back. Use each tissue only once. SEEK MEDICAL CARE IF:   You have back pain.  You develop a fever.  Your symptoms do not begin to resolve within 3 days. SEEK IMMEDIATE MEDICAL CARE IF:   You have severe back pain or lower abdominal pain.  You develop chills.  You have nausea or vomiting.  You have continued burning or discomfort with urination. MAKE SURE YOU:   Understand these instructions.  Will watch your condition.  Will get help right away if you are not doing well or get worse.   This information is not intended to replace advice given to you by your health care provider. Make sure you discuss any questions you have with your health care provider.   Document Released: 11/20/2004 Document Revised: 11/01/2014 Document Reviewed: 03/21/2011 Elsevier Interactive Patient Education 2016 [employer] Viral Gastroenteritis Viral gastroenteritis is also known as stomach flu. This condition affects the stomach and intestinal tract. It can cause sudden diarrhea and vomiting. The illness typically lasts 3 to 8 days. Most people develop an immune response that eventually gets rid of the virus. While this natural response develops, the virus can make you quite ill. CAUSES  Many  Many different viruses can cause gastroenteritis, such as rotavirus or noroviruses. You can catch one of these viruses by consuming contaminated food or water. You may also catch a virus by sharing utensils or other personal items with an infected person or by touching a contaminated surface. °SYMPTOMS  °The most common  symptoms are diarrhea and vomiting. These problems can cause a severe loss of body fluids (dehydration) and a body salt (electrolyte) imbalance. Other symptoms may include: °· Fever. °· Headache. °· Fatigue. °· Abdominal pain. °DIAGNOSIS  °Your caregiver can usually diagnose viral gastroenteritis based on your symptoms and a physical exam. A stool sample may also be taken to test for the presence of viruses or other infections. °TREATMENT  °This illness typically goes away on its own. Treatments are aimed at rehydration. The most serious cases of viral gastroenteritis involve vomiting so severely that you are not able to keep fluids down. In these cases, fluids must be given through an intravenous line (IV). °HOME CARE INSTRUCTIONS  °· Drink enough fluids to keep your urine clear or pale yellow. Drink small amounts of fluids frequently and increase the amounts as tolerated. °· Ask your caregiver for specific rehydration instructions. °· Avoid: °· Foods high in sugar. °· Alcohol. °· Carbonated drinks. °· Tobacco. °· Juice. °· Caffeine drinks. °· Extremely hot or cold fluids. °· Fatty, greasy foods. °· Too much intake of anything at one time. °· Dairy products until 24 to 48 hours after diarrhea stops. °· You may consume probiotics. Probiotics are active cultures of beneficial bacteria. They may lessen the amount and number of diarrheal stools in adults. Probiotics can be found in yogurt with active cultures and in supplements. °· Wash your hands well to avoid spreading the virus. °· Only take over-the-counter or prescription medicines for pain, discomfort, or fever as directed by your caregiver. Do not give aspirin to children. Antidiarrheal medicines are not recommended. °· Ask your caregiver if you should continue to take your regular prescribed and over-the-counter medicines. °· Keep all follow-up appointments as directed by your caregiver. °SEEK IMMEDIATE MEDICAL CARE IF:  °· You are unable to keep fluids  down. °· You do not urinate at least once every 6 to 8 hours. °· You develop shortness of breath. °· You notice blood in your stool or vomit. This may look like coffee grounds. °· You have abdominal pain that increases or is concentrated in one small area (localized). °· You have persistent vomiting or diarrhea. °· You have a fever. °· The patient is a child younger than 3 months, and he or she has a fever. °· The patient is a child older than 3 months, and he or she has a fever and persistent symptoms. °· The patient is a child older than 3 months, and he or she has a fever and symptoms suddenly get worse. °· The patient is a baby, and he or she has no tears when crying. °MAKE SURE YOU:  °· Understand these instructions. °· Will watch your condition. °· Will get help right away if you are not doing well or get worse. °  °This information is not intended to replace advice given to you by your health care provider. Make sure you discuss any questions you have with your health care provider. °  °Document Released: 02/10/2005 Document Revised: 05/05/2011 Document Reviewed: 11/27/2010 °Elsevier Interactive Patient Education ©2016 [employer]vier Inc. ° °

## 2015-08-06 NOTE — ED Provider Notes (Signed)
CSN: [id number]     Arrival date & time 08/06/15  1319 History   First MD Initiated Contact with Patient 08/06/15 1334     Chief Complaint  Patient presents with  . Emesis   Patient is a 70 y.o. female presenting with vomiting.  Emesis  Pt was feeling well yesterday.  This morning she woke up with nausea, vomiting and diarrhea.  She has vomited at least three times and had 5 episodes of diarrhea.  She is feeling weak and lightheaded.  SHe does have dysuria and frequency. No fever.   No abd pain.  No headache.  No blurred vision.  She has diabetes and her sugar was running low today but she was able to drink some coke to bring it up.   Past Medical History  Diagnosis Date  . Hypertension   . Diabetes mellitus   . Hyperlipemia   . Migraine    Past Surgical History  Procedure Laterality Date  . Abdominal hysterectomy    . Slt laser application Left 04/12/2012    Procedure: SLT LASER APPLICATION;  Surgeon: Susa Simmondsarroll F Haines, MD;  Location: [facility];  Service: Ophthalmology;  Laterality: Left;   History reviewed. No pertinent family history. Social History  Substance Use Topics  . Smoking status: Never Smoker   . Smokeless tobacco: None  . Alcohol Use: No   OB History    No data available     Review of Systems  Gastrointestinal: Positive for vomiting.  All other systems reviewed and are negative.     Allergies  Bactrim; Nitrofuran derivatives; Nubain; Pyridium; Vistaril; and Macrobid  Home Medications   Prior to Admission medications   Medication Sig Start Date End Date Taking? Authorizing Provider  amLODipine (NORVASC) 5 MG tablet Take 5 mg by mouth daily.   Yes Historical Provider, MD  aspirin EC 81 MG tablet Take 81 mg by mouth daily.     Yes Historical Provider, MD  benazepril (LOTENSIN) 20 MG tablet Take 20 mg by mouth daily.     Yes Historical Provider, MD  Cholecalciferol (VITAMIN D-3) 5000 UNITS TABS Take 1 tablet by mouth 2 (two) times daily.     Yes Historical  Provider, MD  fenofibrate 160 MG tablet Take 160 mg by mouth daily.     Yes Historical Provider, MD  glimepiride (AMARYL) 4 MG tablet Take 8 mg by mouth daily before breakfast.     Yes Historical Provider, MD  hydrochlorothiazide (MICROZIDE) 12.5 MG capsule Take 12.5 mg by mouth daily.   Yes Historical Provider, MD  ibuprofen (ADVIL,MOTRIN) 200 MG tablet Take 400 mg by mouth every 6 (six) hours as needed. For pain    Yes Historical Provider, MD  metFORMIN (GLUCOPHAGE) 1000 MG tablet Take 1,000 mg by mouth daily with breakfast.     Yes Historical Provider, MD  pravastatin (PRAVACHOL) 40 MG tablet Take 40 mg by mouth daily.     Yes Historical Provider, MD  cephALEXin (KEFLEX) 500 MG capsule Take 1 capsule (500 mg total) by mouth 4 (four) times daily. 08/06/15   Linwood DibblesJon Keval Nam, MD  ondansetron (ZOFRAN) 4 MG tablet Take 1 tablet (4 mg total) by mouth every 6 (six) hours. 08/06/15   Linwood DibblesJon Carry Ortez, MD   BP 149/72 mmHg  Pulse 62  Temp(Src) 98.5 F (36.9 C) (Oral)  Resp 18  Ht 5\' 6"  (1.676 m)  Wt 63.504 kg  BMI 22.61 kg/m2  SpO2 99% Physical Exam  Constitutional: She appears well-developed and well-nourished. No  distress.  HENT:  Head: Normocephalic and atraumatic.  Right Ear: External ear normal.  Left Ear: External ear normal.  Nl tms   Eyes: Conjunctivae are normal. Right eye exhibits no discharge. Left eye exhibits no discharge. No scleral icterus.  Neck: Neck supple. No tracheal deviation present.  Cardiovascular: Normal rate, regular rhythm and intact distal pulses.   Pulmonary/Chest: Effort normal and breath sounds normal. No stridor. No respiratory distress. She has no wheezes. She has no rales.  Abdominal: Soft. Bowel sounds are normal. She exhibits no distension. There is no tenderness. There is no rebound and no guarding.  Musculoskeletal: She exhibits no edema or tenderness.  Neurological: She is alert. She has normal strength. No cranial nerve deficit (no facial droop, extraocular  movements intact, no slurred speech) or sensory deficit. She exhibits normal muscle tone. She displays no seizure activity. Coordination normal.  Skin: Skin is warm and dry. No rash noted.  Psychiatric: She has a normal mood and affect.  Nursing note and vitals reviewed.   ED Course  Procedures (including critical care time)  ED MEDS  Medications  sodium chloride 0.9 % bolus 1,000 mL (1,000 mLs Intravenous New Bag/Given 08/06/15 1409)    And  0.9 %  sodium chloride infusion (not administered)  ondansetron (ZOFRAN) injection 4 mg (4 mg Intravenous Given 08/06/15 1409)    Labs Review Labs Reviewed  COMPREHENSIVE METABOLIC PANEL - Abnormal; Notable for the following:    Glucose, Bld 195 (*)    BUN 25 (*)    Alkaline Phosphatase 34 (*)    All other components within normal limits  URINALYSIS, ROUTINE W REFLEX MICROSCOPIC (NOT AT [facility]) - Abnormal; Notable for the following:    Leukocytes, UA MODERATE (*)    All other components within normal limits  URINE MICROSCOPIC-ADD ON - Abnormal; Notable for the following:    Squamous Epithelial / LPF 0-5 (*)    Bacteria, UA FEW (*)    All other components within normal limits  CBG MONITORING, ED - Abnormal; Notable for the following:    Glucose-Capillary 197 (*)    All other components within normal limits  CBC WITH DIFFERENTIAL/PLATELET     MDM   Final diagnoses:  Vomiting and diarrhea  UTI (lower urinary tract infection)   Pt improved with iv fluids and zofran.    Labs show mild dehydration.  No acidosis. UA is consistent with a uti.  Clinically doubt pyelo. Likely viral GE.  Will dc home with zofran and keflex for the uti.     Linwood Dibbles, MD 08/06/15 [phone]

## 2016-05-15 ENCOUNTER — Emergency Department (HOSPITAL_COMMUNITY): Payer: Medicare Other

## 2016-05-15 ENCOUNTER — Encounter (HOSPITAL_COMMUNITY): Payer: Self-pay | Admitting: Emergency Medicine

## 2016-05-15 ENCOUNTER — Emergency Department (HOSPITAL_COMMUNITY)
Admission: EM | Admit: 2016-05-15 | Discharge: 2016-05-15 | Disposition: A | Discharge status: Home / Self Care | Payer: Medicare Other | Attending: Emergency Medicine | Admitting: Emergency Medicine

## 2016-05-15 DIAGNOSIS — R112 Nausea with vomiting, unspecified: Secondary | ICD-10-CM | POA: Diagnosis not present

## 2016-05-15 DIAGNOSIS — E119 Type 2 diabetes mellitus without complications: Secondary | ICD-10-CM | POA: Insufficient documentation

## 2016-05-15 DIAGNOSIS — Z7982 Long term (current) use of aspirin: Secondary | ICD-10-CM | POA: Insufficient documentation

## 2016-05-15 DIAGNOSIS — Z7984 Long term (current) use of oral hypoglycemic drugs: Secondary | ICD-10-CM | POA: Diagnosis not present

## 2016-05-15 DIAGNOSIS — R42 Dizziness and giddiness: Secondary | ICD-10-CM | POA: Insufficient documentation

## 2016-05-15 DIAGNOSIS — Z79899 Other long term (current) drug therapy: Secondary | ICD-10-CM | POA: Diagnosis not present

## 2016-05-15 DIAGNOSIS — I10 Essential (primary) hypertension: Secondary | ICD-10-CM | POA: Diagnosis not present

## 2016-05-15 LAB — URINALYSIS, ROUTINE W REFLEX MICROSCOPIC
BILIRUBIN URINE: NEGATIVE
Glucose, UA: NEGATIVE mg/dL
Ketones, ur: NEGATIVE mg/dL
NITRITE: NEGATIVE
Protein, ur: NEGATIVE mg/dL
SPECIFIC GRAVITY, URINE: 1.013 (ref 1.005–1.030)
pH: 6 (ref 5.0–8.0)

## 2016-05-15 LAB — BASIC METABOLIC PANEL
ANION GAP: 9 (ref 5–15)
BUN: 25 mg/dL — AB (ref 6–20)
CHLORIDE: 108 mmol/L (ref 101–111)
CO2: 25 mmol/L (ref 22–32)
Calcium: 9.8 mg/dL (ref 8.9–10.3)
Creatinine, Ser: 0.87 mg/dL (ref 0.44–1.00)
GFR calc Af Amer: >60 mL/min (ref >60)
Glucose, Bld: 202 mg/dL — ABNORMAL HIGH (ref 65–99)
Potassium: 3.8 mmol/L (ref 3.5–5.1)
Sodium: 142 mmol/L (ref 135–145)

## 2016-05-15 LAB — CBC
HEMATOCRIT: 36.4 % (ref 36.0–46.0)
Hemoglobin: 12.4 g/dL (ref 12.0–15.0)
MCH: 28.8 pg (ref 26.0–34.0)
MCHC: 34.1 g/dL (ref 30.0–36.0)
MCV: 84.5 fL (ref 78.0–100.0)
Platelets: 241 10*3/uL (ref 150–400)
RBC: 4.31 MIL/uL (ref 3.87–5.11)
RDW: 13.7 % (ref 11.5–15.5)
WBC: 8.1 10*3/uL (ref 4.0–10.5)

## 2016-05-15 MED ORDER — SODIUM CHLORIDE 0.9 % IV SOLN: INTRAVENOUS | Status: DC

## 2016-05-15 MED ORDER — ONDANSETRON HCL 4 MG/2ML IJ SOLN
4.0000 mg | Freq: Once | INTRAMUSCULAR | Status: AC | PRN
  Administered 2016-05-15: 4 mg via INTRAVENOUS
  Filled 2016-05-15: qty 2

## 2016-05-15 MED ORDER — ONDANSETRON HCL 4 MG/2ML IJ SOLN: 4.0000 mg | Freq: Once | INTRAMUSCULAR | Status: DC

## 2016-05-15 MED ORDER — ONDANSETRON 4 MG PO TBDP: 4.0000 mg | ORAL_TABLET | Freq: Three times a day (TID) | ORAL | 1 refills | Status: AC | PRN

## 2016-05-15 MED ORDER — MECLIZINE HCL 12.5 MG PO TABS
25.0000 mg | ORAL_TABLET | Freq: Once | ORAL | Status: AC
  Administered 2016-05-15: 25 mg via ORAL
  Filled 2016-05-15: qty 2

## 2016-05-15 MED ORDER — MECLIZINE HCL 25 MG PO TABS: 25.0000 mg | ORAL_TABLET | Freq: Three times a day (TID) | ORAL | 0 refills | Status: AC | PRN

## 2016-05-15 NOTE — ED Triage Notes (Signed)
Patient c/o dizziness with nausea and vomiting that started this morning upon waking. Patient states "feels like room is spinning." Per patient dizziness worse with movement. Patient reports nausea and vomiting. Denies any neurological deficits or headache.

## 2016-05-15 NOTE — Discharge Instructions (Signed)
Take the Antivert as directed. Take Zofran as needed for nausea and vomiting. Make an appointment to follow-up with neurology. Return for any new or worse symptoms or any strokelike symptoms.

## 2016-05-15 NOTE — ED Triage Notes (Signed)
Per patient has had similar episode in past in which she improved with meclizine and phenergan.

## 2016-05-15 NOTE — ED Provider Notes (Addendum)
[facility] Provider Note   CSN: [id number] Arrival date & time: 05/15/16  1336     History   Chief Complaint Chief Complaint  Patient presents with  . Dizziness    HPI Linda Patrick is a 71 y.o. female.  Patient with acute onset of room spinning vertigo at 10:30 this morning. Associated with vomiting 4. No speech difficulties no numbness no weakness. No headache. Patient without history of upper respiratory infection. Patient was told by her primary care doctor she had some fluid on her right ear in the last 2 weeks. No ear pain.      Past Medical History:  Diagnosis Date  . Diabetes mellitus   . Hyperlipemia   . Hypertension   . Migraine     Patient Active Problem List   Diagnosis Date Noted  . Viral syndrome 05/11/2014  . DM (diabetes mellitus) (HCC) 05/11/2014  . Hyperlipidemia 05/11/2014  . UTI (urinary tract infection) 05/11/2014  . Dehydration   . Diarrhea   . Influenza with respiratory manifestations     Past Surgical History:  Procedure Laterality Date  . ABDOMINAL HYSTERECTOMY    . SLT LASER APPLICATION Left 04/12/2012   Procedure: SLT LASER APPLICATION;  Surgeon: Susa Simmondsarroll F Haines, MD;  Location: [facility];  Service: Ophthalmology;  Laterality: Left;    OB History    Gravida Para Term Preterm AB Living   2 2 2     2    SAB TAB Ectopic Multiple Live Births                   Home Medications    Prior to Admission medications   Medication Sig Start Date End Date Taking? Authorizing Provider  amLODipine (NORVASC) 5 MG tablet Take 5 mg by mouth daily.   Yes Historical Provider, MD  aspirin EC 81 MG tablet Take 81 mg by mouth daily.     Yes Historical Provider, MD  benazepril (LOTENSIN) 20 MG tablet Take 20 mg by mouth daily.     Yes Historical Provider, MD  Cholecalciferol (VITAMIN D3) 5000 units CAPS Take 5,000 Units by mouth 2 (two) times daily.   Yes Historical Provider, MD  fenofibrate 160 MG tablet Take 160 mg by mouth daily.     Yes  Historical Provider, MD  glimepiride (AMARYL) 4 MG tablet Take 8 mg by mouth daily before breakfast.     Yes Historical Provider, MD  hydrochlorothiazide (MICROZIDE) 12.5 MG capsule Take 12.5 mg by mouth daily.   Yes Historical Provider, MD  ibuprofen (ADVIL,MOTRIN) 200 MG tablet Take 600 mg by mouth every 6 (six) hours as needed for fever, headache, mild pain or moderate pain.    Yes Historical Provider, MD  metFORMIN (GLUCOPHAGE) 1000 MG tablet Take 1,000 mg by mouth daily with breakfast.     Yes Historical Provider, MD  meclizine (ANTIVERT) 25 MG tablet Take 1 tablet (25 mg total) by mouth 3 (three) times daily as needed for dizziness. 05/15/16   Vanetta MuldersScott Kermit Arnette, MD  ondansetron (ZOFRAN ODT) 4 MG disintegrating tablet Take 1 tablet (4 mg total) by mouth every 8 (eight) hours as needed. 05/15/16   Vanetta MuldersScott Alizandra Loh, MD    Family History History reviewed. No pertinent family history.  Social History Social History  Substance Use Topics  . Smoking status: Never Smoker  . Smokeless tobacco: Never Used  . Alcohol use No     Allergies   Bactrim [sulfamethoxazole-trimethoprim]; Nubain [nalbuphine hcl]; Pyridium [phenazopyridine hcl]; Vistaril [hydroxyzine pamoate];  Macrobid [nitrofurantoin monohyd macro]; and Nitrofuran derivatives   Review of Systems Review of Systems  Constitutional: Negative for fever.  HENT: Negative for congestion.   Respiratory: Negative for shortness of breath.   Cardiovascular: Negative for chest pain.  Gastrointestinal: Positive for nausea and vomiting. Negative for abdominal pain and diarrhea.  Genitourinary: Negative for dysuria.  Musculoskeletal: Negative for back pain.  Skin: Negative for rash.  Neurological: Positive for dizziness. Negative for speech difficulty, weakness, numbness and headaches.  Hematological: Does not bruise/bleed easily.  Psychiatric/Behavioral: Negative for confusion.     Physical Exam Updated Vital Signs BP (!) 171/63   Pulse  79   Temp 98.6 F (37 C) (Oral)   Resp (!) 22   Ht 5' (1.524 m)   Wt 63.5 kg   SpO2 99%   BMI 27.34 kg/m   Physical Exam  Constitutional: She appears well-developed and well-nourished.  HENT:  Head: Normocephalic and atraumatic.  Right Ear: External ear normal.  Left Ear: External ear normal.  Mouth/Throat: Oropharynx is clear and moist. No oropharyngeal exudate.  Eyes: Conjunctivae and EOM are normal. Pupils are equal, round, and reactive to light.  Neck: Normal range of motion. Neck supple.  Cardiovascular: Normal rate and regular rhythm.   Pulmonary/Chest: Effort normal and breath sounds normal.  Abdominal: Bowel sounds are normal. There is no tenderness.  Musculoskeletal: Normal range of motion.  Neurological: She is alert. No cranial nerve deficit or sensory deficit. She exhibits normal muscle tone. Coordination normal.  Skin: Skin is warm.  Nursing note and vitals reviewed.    ED Treatments / Results  Labs (all labs ordered are listed, but only abnormal results are displayed) Labs Reviewed  BASIC METABOLIC PANEL - Abnormal; Notable for the following:       Result Value   Glucose, Bld 202 (*)    BUN 25 (*)    All other components within normal limits  CBC  URINALYSIS, ROUTINE W REFLEX MICROSCOPIC    EKG  EKG Interpretation  Date/Time:  Thursday May 15 2016 14:03:45 EDT Ventricular Rate:  68 PR Interval:  106 QRS Duration: 86 QT Interval:  406 QTC Calculation: 431 R Axis:   33 Text Interpretation:  Sinus rhythm with short PR Otherwise normal ECG Since last tracing rate slower Confirmed by Ethelda Chick  MD, SAM [phone]) on 05/15/2016 2:14:33 PM Also confirmed by Deretha Emory  MD, Bryella Diviney [phone])  on 05/15/2016 3:41:34 PM       Radiology Ct Head Wo Contrast  Result Date: 05/15/2016 CLINICAL DATA:  Dizziness, nausea and vomiting starting this morning while walking, vertigo EXAM: CT HEAD WITHOUT CONTRAST TECHNIQUE: Contiguous axial images were obtained from the  base of the skull through the vertex without intravenous contrast. COMPARISON:  None. FINDINGS: Brain: No intracranial hemorrhage, mass effect or midline shift. No acute cortical infarction. No mass lesion is noted on this unenhanced scan. Mild cerebral atrophy. Mild periventricular chronic white matter disease. No intraventricular hemorrhage. Vascular: No hyperdense vessel or unexpected calcification. Skull: Normal. Negative for fracture or focal lesion. Sinuses/Orbits: No acute finding. Other: None IMPRESSION: No acute intracranial abnormality. Mild cerebral atrophy. Mild periventricular chronic white matter disease. No definite acute cortical infarction. Electronically Signed   By: Natasha Mead M.D.   On: 05/15/2016 16:55    Procedures Procedures (including critical care time)  Medications Ordered in ED Medications  0.9 %  sodium chloride infusion (not administered)  ondansetron (ZOFRAN) injection 4 mg (4 mg Intravenous Not Given 05/15/16 1601)  ondansetron (ZOFRAN)  injection 4 mg (not administered)  ondansetron (ZOFRAN) injection 4 mg (4 mg Intravenous Given 05/15/16 1506)  meclizine (ANTIVERT) tablet 25 mg (25 mg Oral Given 05/15/16 1600)     Initial Impression / Assessment and Plan / ED Course  I have reviewed the triage vital signs and the nursing notes.  Pertinent labs & imaging results that were available during my care of the patient were reviewed by me and considered in my medical decision making (see chart for details).     Patient with acute onset of vertigo that started this morning at 10:30. Vomited 4. Still feeling nauseated. Patient had similar symptoms 15 years ago. Discussed today about possibility of stroke as the cause of the vertigo. Patient refuses MRI understands the risks. But did consent to CT of the head which was negative. She does know that the CT of the head does not completely rule out a stroke. Patient improved here with meclizine. And antinausea medicine. Will give  patient referral to neurology and continue Zofran and meclizine at home.  Final Clinical Impressions(s) / ED Diagnoses   Final diagnoses:  Vertigo    New Prescriptions New Prescriptions   MECLIZINE (ANTIVERT) 25 MG TABLET    Take 1 tablet (25 mg total) by mouth 3 (three) times daily as needed for dizziness.   ONDANSETRON (ZOFRAN ODT) 4 MG DISINTEGRATING TABLET    Take 1 tablet (4 mg total) by mouth every 8 (eight) hours as needed.     Vanetta Mulders, MD 05/15/16 1725    Vanetta Mulders, MD 05/15/16 [phone]

## 2016-08-05 ENCOUNTER — Emergency Department (HOSPITAL_COMMUNITY)
Admission: EM | Admit: 2016-08-05 | Discharge: 2016-08-06 | Disposition: A | Discharge status: Home / Self Care | Payer: Medicare Other | Attending: Emergency Medicine | Admitting: Emergency Medicine

## 2016-08-05 ENCOUNTER — Encounter (HOSPITAL_COMMUNITY): Payer: Self-pay

## 2016-08-05 DIAGNOSIS — E119 Type 2 diabetes mellitus without complications: Secondary | ICD-10-CM | POA: Diagnosis not present

## 2016-08-05 DIAGNOSIS — Z7982 Long term (current) use of aspirin: Secondary | ICD-10-CM | POA: Insufficient documentation

## 2016-08-05 DIAGNOSIS — Z7984 Long term (current) use of oral hypoglycemic drugs: Secondary | ICD-10-CM | POA: Diagnosis not present

## 2016-08-05 DIAGNOSIS — Z79899 Other long term (current) drug therapy: Secondary | ICD-10-CM | POA: Insufficient documentation

## 2016-08-05 DIAGNOSIS — N390 Urinary tract infection, site not specified: Secondary | ICD-10-CM | POA: Diagnosis not present

## 2016-08-05 DIAGNOSIS — I10 Essential (primary) hypertension: Secondary | ICD-10-CM | POA: Diagnosis not present

## 2016-08-05 DIAGNOSIS — R112 Nausea with vomiting, unspecified: Secondary | ICD-10-CM

## 2016-08-05 DIAGNOSIS — R197 Diarrhea, unspecified: Secondary | ICD-10-CM | POA: Diagnosis not present

## 2016-08-05 HISTORY — DX: Urinary tract infection, site not specified: N39.0

## 2016-08-05 LAB — URINALYSIS, ROUTINE W REFLEX MICROSCOPIC
BILIRUBIN URINE: NEGATIVE
Glucose, UA: NEGATIVE mg/dL
Ketones, ur: NEGATIVE mg/dL
Nitrite: POSITIVE — AB
PROTEIN: NEGATIVE mg/dL
SPECIFIC GRAVITY, URINE: 1.02 (ref 1.005–1.030)
pH: 5 (ref 5.0–8.0)

## 2016-08-05 LAB — CBC
HEMATOCRIT: 41.4 % (ref 36.0–46.0)
Hemoglobin: 13.8 g/dL (ref 12.0–15.0)
MCH: 28.3 pg (ref 26.0–34.0)
MCHC: 33.3 g/dL (ref 30.0–36.0)
MCV: 85 fL (ref 78.0–100.0)
PLATELETS: 230 10*3/uL (ref 150–400)
RBC: 4.87 MIL/uL (ref 3.87–5.11)
RDW: 14.5 % (ref 11.5–15.5)
WBC: 8.4 10*3/uL (ref 4.0–10.5)

## 2016-08-05 LAB — COMPREHENSIVE METABOLIC PANEL
ALBUMIN: 4 g/dL (ref 3.5–5.0)
ALT: 39 U/L (ref 14–54)
AST: 52 U/L — AB (ref 15–41)
Alkaline Phosphatase: 26 U/L — ABNORMAL LOW (ref 38–126)
Anion gap: 11 (ref 5–15)
BUN: 27 mg/dL — AB (ref 6–20)
CHLORIDE: 104 mmol/L (ref 101–111)
CO2: 24 mmol/L (ref 22–32)
CREATININE: 1.08 mg/dL — AB (ref 0.44–1.00)
Calcium: 9.6 mg/dL (ref 8.9–10.3)
GFR calc Af Amer: 58 mL/min — ABNORMAL LOW (ref >60)
GFR calc non Af Amer: 50 mL/min — ABNORMAL LOW (ref >60)
GLUCOSE: 209 mg/dL — AB (ref 65–99)
POTASSIUM: 3.5 mmol/L (ref 3.5–5.1)
SODIUM: 139 mmol/L (ref 135–145)
Total Bilirubin: 0.6 mg/dL (ref 0.3–1.2)
Total Protein: 7.5 g/dL (ref 6.5–8.1)

## 2016-08-05 LAB — CBG MONITORING, ED: GLUCOSE-CAPILLARY: 205 mg/dL — AB (ref 65–99)

## 2016-08-05 LAB — LIPASE, BLOOD: LIPASE: 38 U/L (ref 11–51)

## 2016-08-05 MED ORDER — METOCLOPRAMIDE HCL 5 MG/ML IJ SOLN
5.0000 mg | Freq: Once | INTRAMUSCULAR | Status: AC
  Administered 2016-08-05: 5 mg via INTRAVENOUS
  Filled 2016-08-05: qty 2

## 2016-08-05 MED ORDER — ONDANSETRON HCL 4 MG/2ML IJ SOLN
4.0000 mg | Freq: Once | INTRAMUSCULAR | Status: AC | PRN
  Administered 2016-08-05: 4 mg via INTRAVENOUS
  Filled 2016-08-05: qty 2

## 2016-08-05 MED ORDER — SODIUM CHLORIDE 0.9 % IV BOLUS (SEPSIS)
1000.0000 mL | Freq: Once | INTRAVENOUS | Status: AC
  Administered 2016-08-05: 1000 mL via INTRAVENOUS

## 2016-08-05 NOTE — ED Triage Notes (Signed)
Patient states that she has been sick since last night after eating corn and a sandwich.  States that she has been vomiting since this morning.  Having diarrhea also.

## 2016-08-05 NOTE — ED Notes (Signed)
Pt reports last episode of emesis or diarrhea was 5 hours ago. Denies abd pain, endorses N/.

## 2016-08-05 NOTE — ED Provider Notes (Signed)
[facility] Provider Note   CSN: [id number] Arrival date & time: 08/05/16  2035     History   Chief Complaint Chief Complaint  Patient presents with  . Emesis    HPI Linda Patrick is a 71 y.o. female.  Patient reports nausea, vomiting, diarrhea that has been present through the course of the day. She reports that it started around 10 AM. She reports that she awakened this morning, ate corn on it, and a sandwich. After that she started having nausea and then began vomiting. She has been sick all day, unable to take anything and because it comes right back up or causes diarrhea. She has not been noticing any abdominal pain associated with the symptoms. No hematemesis, melena or hematochezia. She has not had a fever.       Past Medical History:  Diagnosis Date  . Diabetes mellitus   . Hyperlipemia   . Hypertension   . Migraine   . UTI (urinary tract infection)     Patient Active Problem List   Diagnosis Date Noted  . Viral syndrome 05/11/2014  . DM (diabetes mellitus) (HCC) 05/11/2014  . Hyperlipidemia 05/11/2014  . UTI (urinary tract infection) 05/11/2014  . Dehydration   . Diarrhea   . Influenza with respiratory manifestations     Past Surgical History:  Procedure Laterality Date  . ABDOMINAL HYSTERECTOMY    . SLT LASER APPLICATION Left 04/12/2012   Procedure: SLT LASER APPLICATION;  Surgeon: [name], MD;  Location: [facility];  Service: Ophthalmology;  Laterality: Left;    OB History    Gravida Para Term Preterm AB Living   2 2 2     2    SAB TAB Ectopic Multiple Live Births                   Home Medications    Prior to Admission medications   Medication Sig Start Date End Date Taking? Authorizing Provider  amLODipine (NORVASC) 5 MG tablet Take 5 mg by mouth daily.   Yes [provider]  aspirin EC 81 MG tablet Take 81 mg by mouth daily.     Yes [provider]  benazepril (LOTENSIN) 40 MG tablet Take 40 mg by mouth daily.     Yes [provider]  Cholecalciferol (VITAMIN D3) 5000 units CAPS Take 5,000 Units by mouth daily.    Yes [provider]  fenofibrate 160 MG tablet Take 160 mg by mouth daily.     Yes [provider]  glimepiride (AMARYL) 4 MG tablet Take 8 mg by mouth daily before breakfast.     Yes [provider]  hydrochlorothiazide (MICROZIDE) 12.5 MG capsule Take 12.5 mg by mouth daily.   Yes [provider]  ibuprofen (ADVIL,MOTRIN) 200 MG tablet Take 600 mg by mouth every 6 (six) hours as needed for fever, headache, mild pain or moderate pain.    Yes [provider]  metFORMIN (GLUCOPHAGE) 1000 MG tablet Take 1,000 mg by mouth 2 (two) times daily with a meal.    Yes [provider]  cephALEXin (KEFLEX) 500 MG capsule Take 1 capsule (500 mg total) by mouth 2 (two) times daily. 08/06/16   Gilda Crease, MD  meclizine (ANTIVERT) 25 MG tablet Take 1 tablet (25 mg total) by mouth 3 (three) times daily as needed for dizziness. Patient not taking: Reported on 08/05/2016 05/15/16   Vanetta Mulders, MD  ondansetron (ZOFRAN ODT) 4 MG disintegrating tablet Take 1  tablet (4 mg total) by mouth every 8 (eight) hours as needed. Patient not taking: Reported on 08/05/2016 05/15/16   Vanetta MuldersZackowski, Scott, MD  ondansetron (ZOFRAN) 4 MG tablet Take 1 tablet (4 mg total) by mouth every 6 (six) hours. 08/06/16   Gilda CreasePollina, Cashmere Dingley J, MD    Family History History reviewed. No pertinent family history.  Social History Social History  Substance Use Topics  . Smoking status: Never Smoker  . Smokeless tobacco: Never Used  . Alcohol use No     Allergies   Bactrim [sulfamethoxazole-trimethoprim]; Nubain [nalbuphine hcl]; Pyridium [phenazopyridine hcl]; Vistaril [hydroxyzine pamoate]; Macrobid [nitrofurantoin monohyd macro]; and Nitrofuran derivatives   Review of Systems Review of Systems  Constitutional: Positive for fatigue. Negative for fever.    Gastrointestinal: Positive for diarrhea, nausea and vomiting. Negative for abdominal pain.  All other systems reviewed and are negative.    Physical Exam Updated Vital Signs BP 139/61   Pulse 84   Temp (!) 100.5 F (38.1 C) (Rectal)   Resp 17   Ht 5\' 4"  (1.626 m)   Wt 63.5 kg (140 lb)   SpO2 99%   BMI 24.03 kg/m   Physical Exam  Constitutional: She is oriented to person, place, and time. She appears well-developed and well-nourished. No distress.  HENT:  Head: Normocephalic and atraumatic.  Right Ear: Hearing normal.  Left Ear: Hearing normal.  Nose: Nose normal.  Mouth/Throat: Oropharynx is clear and moist and mucous membranes are normal.  Eyes: Conjunctivae and EOM are normal. Pupils are equal, round, and reactive to light.  Neck: Normal range of motion. Neck supple.  Cardiovascular: Regular rhythm, S1 normal and S2 normal.  Exam reveals no gallop and no friction rub.   No murmur heard. Pulmonary/Chest: Effort normal and breath sounds normal. No respiratory distress. She exhibits no tenderness.  Abdominal: Soft. Normal appearance and bowel sounds are normal. There is no hepatosplenomegaly. There is no tenderness. There is no rebound, no guarding, no tenderness at McBurney's point and negative Murphy's sign. No hernia.  Musculoskeletal: Normal range of motion.  Neurological: She is alert and oriented to person, place, and time. She has normal strength. No cranial nerve deficit or sensory deficit. Coordination normal. GCS eye subscore is 4. GCS verbal subscore is 5. GCS motor subscore is 6.  Skin: Skin is warm, dry and intact. No rash noted. No cyanosis.  Psychiatric: She has a normal mood and affect. Her speech is normal and behavior is normal. Thought content normal.  Nursing note and vitals reviewed.    ED Treatments / Results  Labs (all labs ordered are listed, but only abnormal results are displayed) Labs Reviewed  COMPREHENSIVE METABOLIC PANEL - Abnormal; Notable  for the following:       Result Value   Glucose, Bld 209 (*)    BUN 27 (*)    Creatinine, Ser 1.08 (*)    AST 52 (*)    Alkaline Phosphatase 26 (*)    GFR calc non Af Amer 50 (*)    GFR calc Af Amer 58 (*)    All other components within normal limits  URINALYSIS, ROUTINE W REFLEX MICROSCOPIC - Abnormal; Notable for the following:    APPearance HAZY (*)    Hgb urine dipstick MODERATE (*)    Nitrite POSITIVE (*)    Leukocytes, UA LARGE (*)    Bacteria, UA RARE (*)    Squamous Epithelial / LPF 6-30 (*)    Non Squamous Epithelial 0-5 (*)  All other components within normal limits  CBG MONITORING, ED - Abnormal; Notable for the following:    Glucose-Capillary 205 (*)    All other components within normal limits  URINE CULTURE  CULTURE, BLOOD (ROUTINE X 2)  CULTURE, BLOOD (ROUTINE X 2)  LIPASE, BLOOD  CBC  CBG MONITORING, ED  I-STAT CG4 LACTIC ACID, ED    EKG  EKG Interpretation None       Radiology No results found.  Procedures Procedures (including critical care time)  Medications Ordered in ED Medications  ondansetron (ZOFRAN) injection 4 mg (4 mg Intravenous Given 08/05/16 2227)  sodium chloride 0.9 % bolus 1,000 mL (0 mLs Intravenous Stopped 08/06/16 0131)  metoCLOPramide (REGLAN) injection 5 mg (5 mg Intravenous Given 08/05/16 2343)  cefTRIAXone (ROCEPHIN) 2 g in dextrose 5 % 50 mL IVPB (2 g Intravenous New Bag/Given 08/06/16 0132)     Initial Impression / Assessment and Plan / ED Course  I have reviewed the triage vital signs and the nursing notes.  Pertinent labs & imaging results that were available during my care of the patient were reviewed by me and considered in my medical decision making (see chart for details).     Patient presented to the emergency department with complaints of nausea, vomiting, diarrhea that have been ongoing for most of the day. She did not have any abdominal pain and had an abdominal exam was benign at arrival. Patient was given  IV fluids, Zofran and Reglan with significant improvement of her symptoms. Blood work has been normal. She started having some chills while here, rectal temp was checked and it was 100.5. Lactic acid is normal. Urinalysis shows infection, urine culture and blood cultures pending. As the patient has had normal vital signs throughout the visit other than elevated temperature, lactate is normal and there is no sign of sepsis, patient is appropriate for discharge. She is feeling much better, no further nausea or vomiting here in the ER. She has been given Rocephin here in the ER and will not require further antibiotics for up to 24 hours. She will be discharged with Keflex 500 mg by mouth twice a day, Zofran and given strict return precautions to return for nausea and vomiting causing her to be unable to take her antibiotics.  Final Clinical Impressions(s) / ED Diagnoses   Final diagnoses:  Nausea vomiting and diarrhea  Urinary tract infection without hematuria, site unspecified    New Prescriptions New Prescriptions   CEPHALEXIN (KEFLEX) 500 MG CAPSULE    Take 1 capsule (500 mg total) by mouth 2 (two) times daily.   ONDANSETRON (ZOFRAN) 4 MG TABLET    Take 1 tablet (4 mg total) by mouth every 6 (six) hours.     Gilda Crease, MD 08/06/16 [phone]

## 2016-08-06 DIAGNOSIS — R197 Diarrhea, unspecified: Secondary | ICD-10-CM | POA: Diagnosis not present

## 2016-08-06 LAB — I-STAT CG4 LACTIC ACID, ED: LACTIC ACID, VENOUS: 0.95 mmol/L (ref 0.5–1.9)

## 2016-08-06 MED ORDER — ONDANSETRON HCL 4 MG PO TABS: 4.0000 mg | ORAL_TABLET | Freq: Four times a day (QID) | ORAL | 0 refills | Status: AC

## 2016-08-06 MED ORDER — CEPHALEXIN 500 MG PO CAPS: 500.0000 mg | ORAL_CAPSULE | Freq: Two times a day (BID) | ORAL | 0 refills | Status: AC

## 2016-08-06 MED ORDER — DEXTROSE 5 % IV SOLN
2.0000 g | Freq: Once | INTRAVENOUS | Status: AC
  Administered 2016-08-06: 2 g via INTRAVENOUS
  Filled 2016-08-06: qty 2

## 2016-08-06 NOTE — Discharge Instructions (Signed)
Return to the emergency department if you are running continuously high fever or if you develop nausea and vomiting that prevents you from taking the antibiotics.

## 2016-08-08 LAB — URINE CULTURE

## 2016-08-09 ENCOUNTER — Telehealth: Payer: Self-pay

## 2016-08-09 NOTE — Telephone Encounter (Signed)
Post ED Visit - Positive Culture Follow-up  Culture report reviewed by antimicrobial stewardship pharmacist:  []  Linda Patrick, Pharm.D. []  Celedonio MiyamotoJeremy Patrick, [address]Ph[address] BCPS AQ-ID [[id number]]  Garvin FilaMike Patrick, Pharm.D., BCPS []  Georgina PillionElizabeth Patrick, Pharm.D., BCPS []  SpringdaleMinh Patrick, [address]Ph[address] BCPS, AAHIVP []  Estella HuskMichelle Patrick, Pharm.D., BCPS, AAHIVP []  Lysle Pearlachel Rumbarger, PharmD, BCPS []  Casilda Carlsaylor Stone, PharmD, BCPS []  Pollyann SamplesAndy Patrick, PharmD, BCPS  Positive urine culture Treated withCephalexin, organism sensitive to the same and no further patient follow-up is required at this time.  Linda Patrick, Linda Patrick 08/09/2016, 11:41 AM

## 2016-08-11 LAB — CULTURE, BLOOD (ROUTINE X 2)
Culture: NO GROWTH
Culture: NO GROWTH
SPECIAL REQUESTS: ADEQUATE
Special Requests: ADEQUATE

## 2018-06-06 IMAGING — CT CT HEAD W/O CM
3 series · 16 of 46 positions shown, 19 images · non-contrast
Comparison: None.

CLINICAL DATA: Dizziness, nausea and vomiting starting this morning
while walking, vertigo

EXAM:
CT HEAD WITHOUT CONTRAST
TECHNIQUE: Contiguous axial images were obtained from the base of the skull
through the vertex without intravenous contrast.

[Series 2: head wo · axial · 0.42mm/px · z∈[+1711,+1831]mm · 10 of 29 slices shown, 13 images]
[im 3/29  brain]
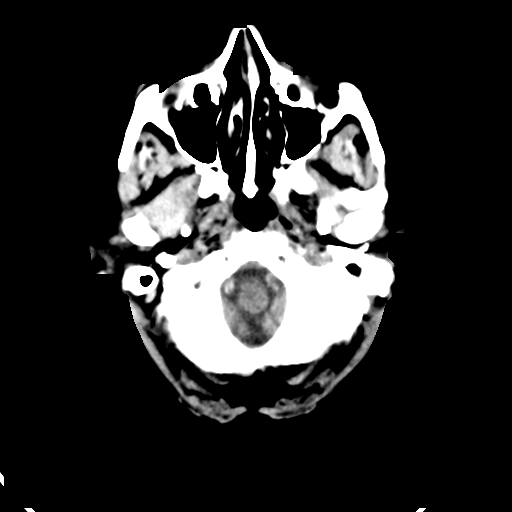
[im 3/29  bone]
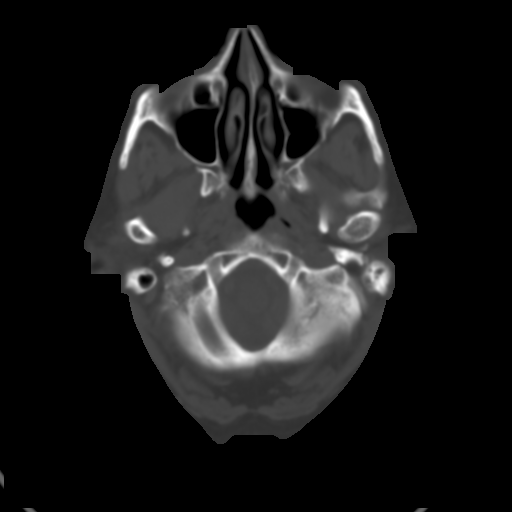
[im 6/29  brain]
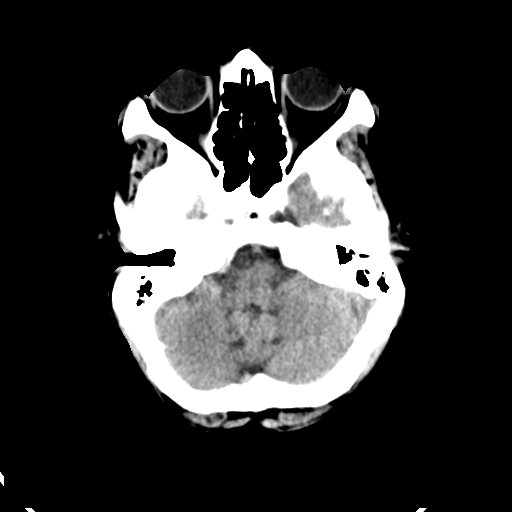
[im 8/29  brain]
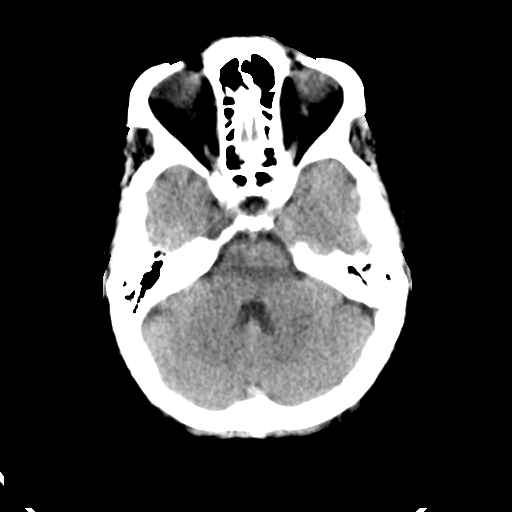
[im 11/29  brain]
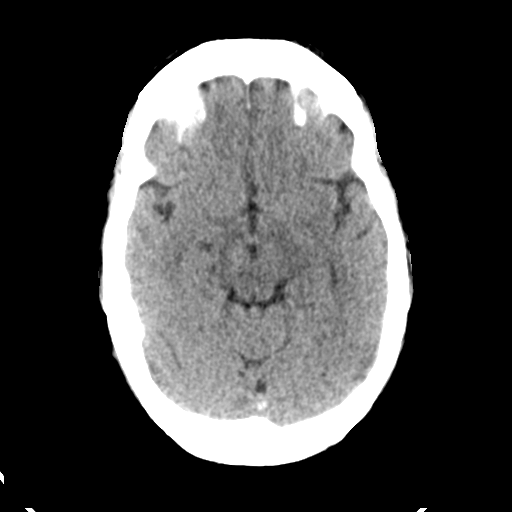
[im 14/29  brain]
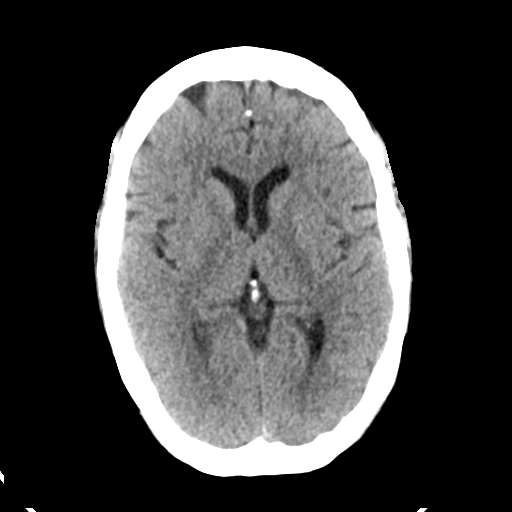
[im 14/29  bone]
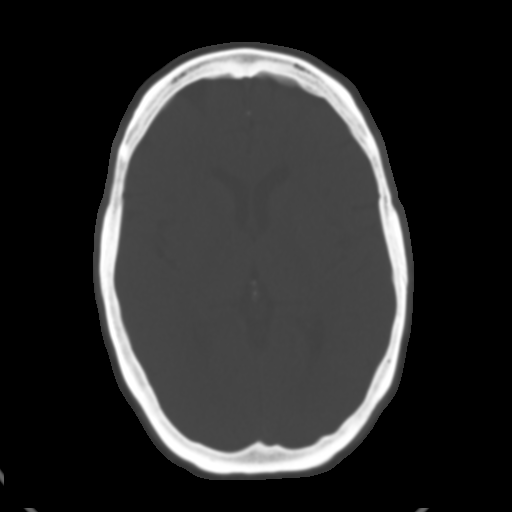
[im 16/29  brain]
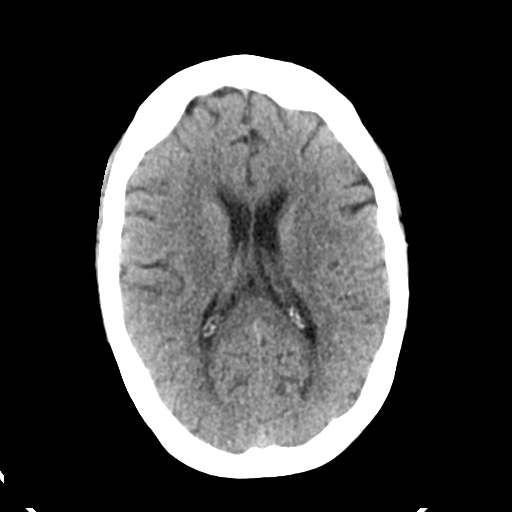
[im 19/29  brain]
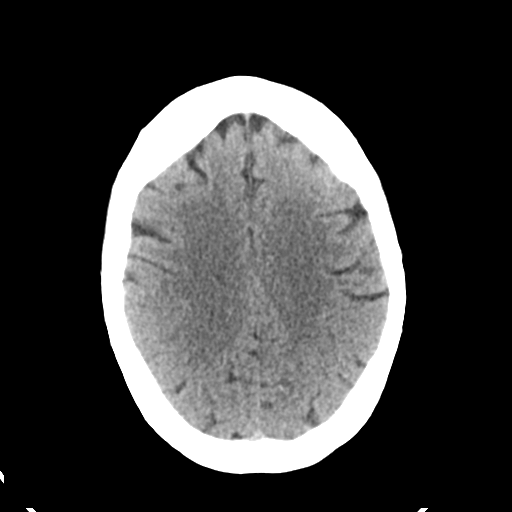
[im 22/29  brain]
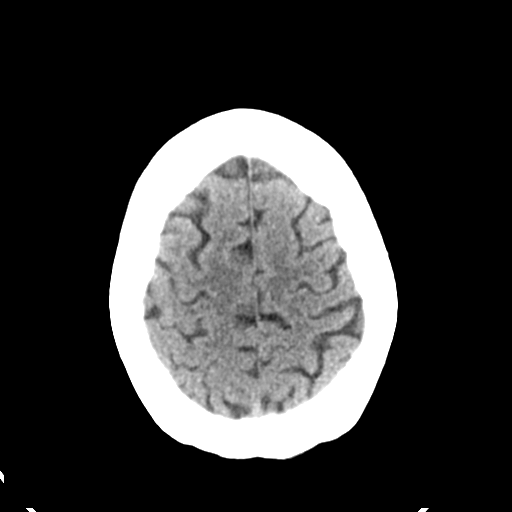
[im 24/29  brain]
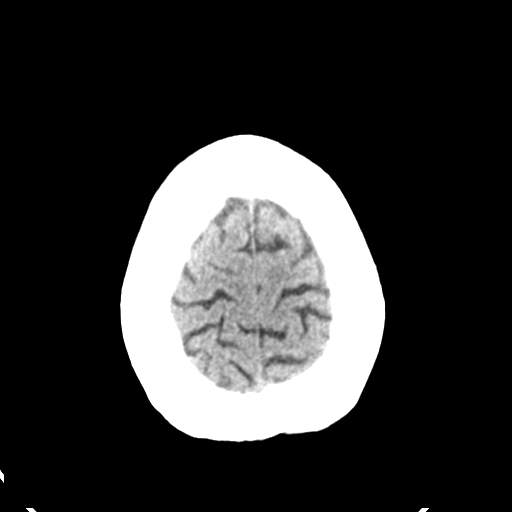
[im 24/29  bone]
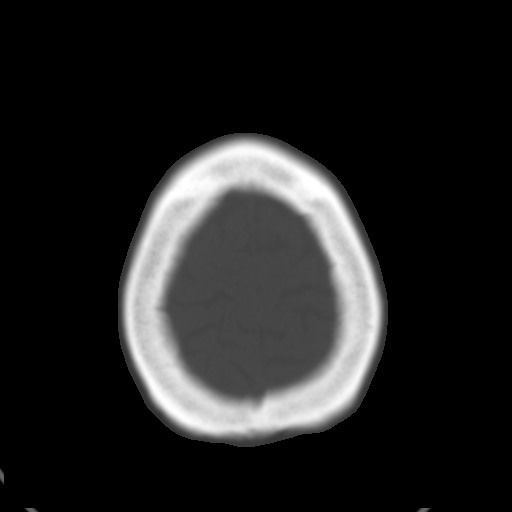
[im 27/29  brain]
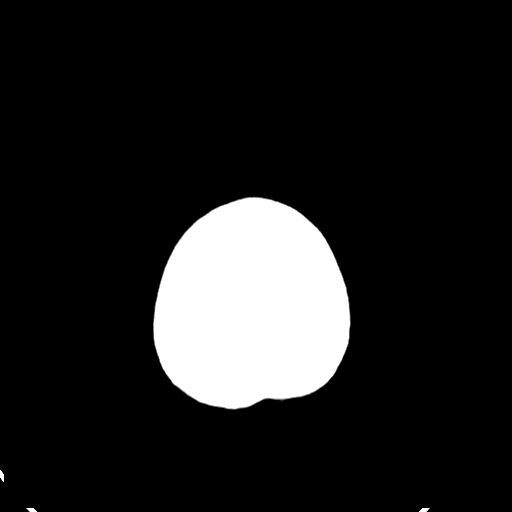

[Series 4: coronal soft tissue · coronal · 0.28mm/px · 3 of 62 slices shown]
[im 21/62  brain]
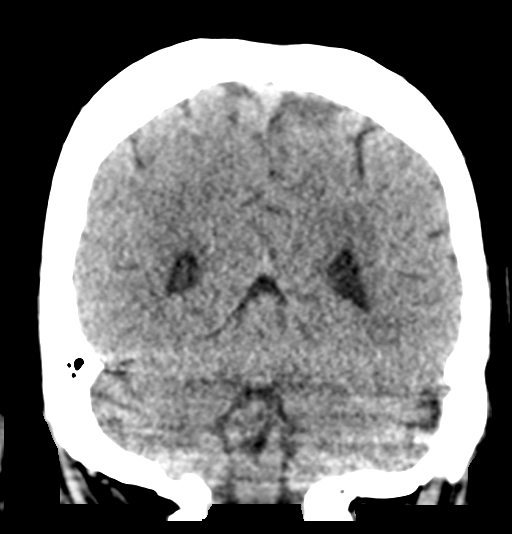
[im 28/62  brain]
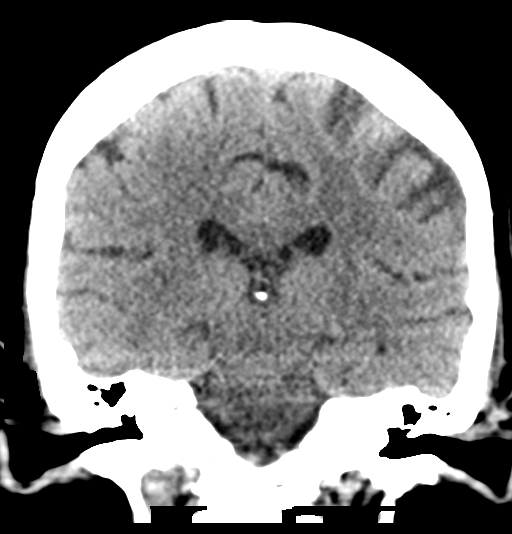
[im 34/62  brain]
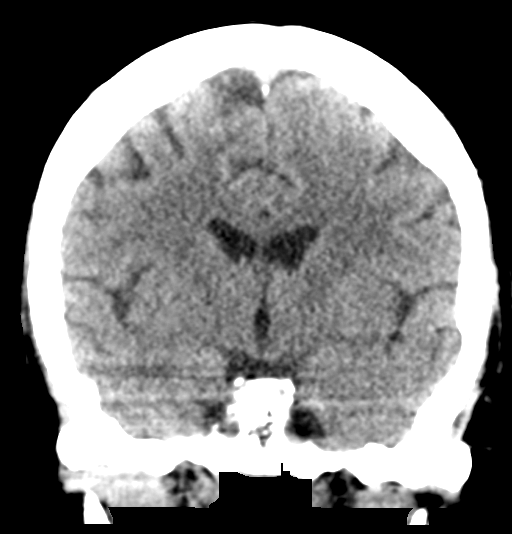

[Series 5: sagittal soft tissue · sagittal · 0.29mm/px · 3 of 48 slices shown]
[im 16/48  brain]
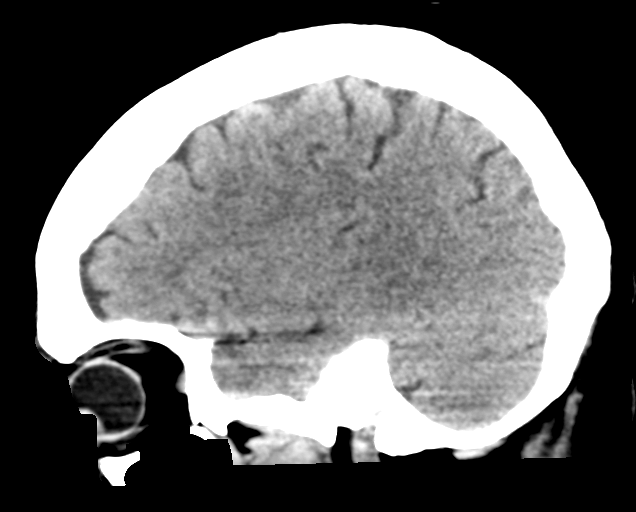
[im 24/48  brain]
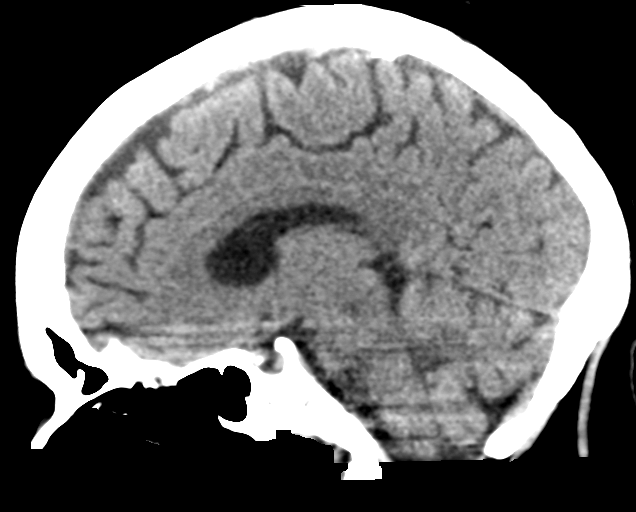
[im 32/48  brain]
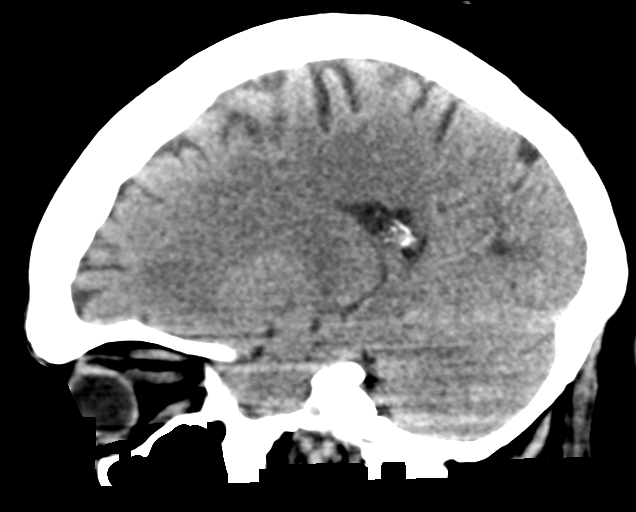

[16 of 46 positions shown; findings below may reference images not displayed]

FINDINGS: Brain: No intracranial hemorrhage, mass effect or midline shift.

No acute cortical infarction. No mass lesion is noted on this
unenhanced scan. Mild cerebral atrophy. Mild periventricular chronic
white matter disease. No intraventricular hemorrhage.

Vascular: No hyperdense vessel or unexpected calcification.

Skull: Normal. Negative for fracture or focal lesion.

Sinuses/Orbits: No acute finding.

Other: None
IMPRESSION: No acute intracranial abnormality. Mild cerebral atrophy. Mild
periventricular chronic white matter disease. No definite acute
cortical infarction.
